# Patient Record
Sex: Female | Born: 1980 | Race: White | Hispanic: No | Marital: Single | Smoking: Current every day smoker
Health system: Southern US, Community
[De-identification: ages and names within clinical notes are randomized; demographics above are authoritative.]

## PROBLEM LIST (undated history)

## (undated) DIAGNOSIS — D649 Anemia, unspecified: Secondary | ICD-10-CM

## (undated) DIAGNOSIS — F410 Panic disorder [episodic paroxysmal anxiety] without agoraphobia: Secondary | ICD-10-CM

## (undated) DIAGNOSIS — M549 Dorsalgia, unspecified: Secondary | ICD-10-CM

## (undated) DIAGNOSIS — G4700 Insomnia, unspecified: Secondary | ICD-10-CM

## (undated) DIAGNOSIS — U071 COVID-19: Secondary | ICD-10-CM

## (undated) DIAGNOSIS — R002 Palpitations: Secondary | ICD-10-CM

## (undated) DIAGNOSIS — Z87442 Personal history of urinary calculi: Secondary | ICD-10-CM

## (undated) DIAGNOSIS — N289 Disorder of kidney and ureter, unspecified: Secondary | ICD-10-CM

## (undated) DIAGNOSIS — F4310 Post-traumatic stress disorder, unspecified: Secondary | ICD-10-CM

## (undated) DIAGNOSIS — R569 Unspecified convulsions: Secondary | ICD-10-CM

## (undated) DIAGNOSIS — G629 Polyneuropathy, unspecified: Secondary | ICD-10-CM

## (undated) DIAGNOSIS — J189 Pneumonia, unspecified organism: Secondary | ICD-10-CM

## (undated) HISTORY — PX: TUBAL LIGATION: SHX77

## (undated) HISTORY — PX: OTHER SURGICAL HISTORY: SHX169

## (undated) HISTORY — PX: TONSILLECTOMY: SUR1361

---

## 2019-09-24 ENCOUNTER — Other Ambulatory Visit: Payer: Self-pay

## 2019-09-24 ENCOUNTER — Emergency Department (HOSPITAL_COMMUNITY)
Admission: EM | Admit: 2019-09-24 | Discharge: 2019-09-24 | Disposition: A | Discharge status: Home / Self Care | Payer: Medicaid - Out of State | Attending: Emergency Medicine | Admitting: Emergency Medicine

## 2019-09-24 ENCOUNTER — Encounter (HOSPITAL_COMMUNITY): Payer: Self-pay | Admitting: Emergency Medicine

## 2019-09-24 DIAGNOSIS — M5442 Lumbago with sciatica, left side: Secondary | ICD-10-CM | POA: Diagnosis not present

## 2019-09-24 DIAGNOSIS — M545 Low back pain: Secondary | ICD-10-CM | POA: Diagnosis present

## 2019-09-24 DIAGNOSIS — F17210 Nicotine dependence, cigarettes, uncomplicated: Secondary | ICD-10-CM | POA: Diagnosis not present

## 2019-09-24 DIAGNOSIS — M5441 Lumbago with sciatica, right side: Secondary | ICD-10-CM | POA: Diagnosis not present

## 2019-09-24 HISTORY — DX: Polyneuropathy, unspecified: G62.9

## 2019-09-24 HISTORY — DX: Dorsalgia, unspecified: M54.9

## 2019-09-24 HISTORY — DX: Palpitations: R00.2

## 2019-09-24 MED ORDER — PREDNISONE 10 MG (21) PO TBPK: ORAL_TABLET | ORAL | 0 refills | Status: DC

## 2019-09-24 MED ORDER — LIDOCAINE 4 % EX CREA: 1.0000 "application " | TOPICAL_CREAM | Freq: Three times a day (TID) | CUTANEOUS | 0 refills | Status: DC | PRN

## 2019-09-24 MED ORDER — CYCLOBENZAPRINE HCL 10 MG PO TABS: 10.0000 mg | ORAL_TABLET | Freq: Two times a day (BID) | ORAL | 0 refills | Status: DC | PRN

## 2019-09-24 MED ORDER — CYCLOBENZAPRINE HCL 10 MG PO TABS
10.0000 mg | ORAL_TABLET | Freq: Once | ORAL | Status: AC
  Administered 2019-09-24: 10 mg via ORAL
  Filled 2019-09-24: qty 1

## 2019-09-24 MED ORDER — HYDROMORPHONE HCL 1 MG/ML IJ SOLN
1.0000 mg | Freq: Once | INTRAMUSCULAR | Status: AC
  Administered 2019-09-24: 1 mg via INTRAMUSCULAR
  Filled 2019-09-24: qty 1

## 2019-09-24 NOTE — Discharge Instructions (Addendum)
1. Medications: Start taking steroid taper as prescribed.  Do not take ibuprofen, Advil, Motrin, or Aleve while taking this medication.  You can take 1 to 2 tablets of Tylenol every 6 hours additionally however.  When you are done taking the steroid taper you can alternate 600 mg of ibuprofen and [phone] mg of Tylenol every 3 hours as needed for pain. Do not exceed 4000 mg of Tylenol daily.  Take ibuprofen with food to avoid upset stomach issues.  You can take Flexeril as needed for muscle spasm up to twice daily but do not drive, drink alcohol, or operate heavy machinery while taking this medicine because it may make you drowsy.  I typically recommend taking this medicine only at night when you are going to sleep.  You can also cut these tablets in half if they make you feel very drowsy.  Apply lidocaine cream to areas of pain. 2. Treatment: rest, drink plenty of fluids, gentle stretching as discussed (see attached), alternate ice and heat (or stick with whichever feels best) 20 minutes on 20 minutes off. 3. Follow Up: Please followup with your primary doctor in 3-7 days for discussion of your diagnoses and further evaluation after today's visit; I have also provided information for a neurosurgeon you can follow-up with; return to the ER for worsening back pain, difficulty walking, loss of bowel or bladder control or other concerning symptoms

## 2019-09-24 NOTE — ED Notes (Signed)
Patient verbalizes understanding of discharge instructions. Opportunity for questioning and answers were provided. Armband removed by staff, pt discharged from ED ambulatory w/ son 

## 2019-09-24 NOTE — ED Triage Notes (Signed)
C/o pain to lower back and posterior neck pain x 4 days.  Reports history of chronic back pain that has been flared-up over the last 4 days.

## 2019-09-24 NOTE — ED Provider Notes (Signed)
[facility] EMERGENC[facility] Provider Note   CSN: [id number] Arrival date & time: 09/24/19  1214     History   Chief Complaint Chief Complaint  Patient presents with  . Back Pain    HPI Anne Meyers is a 38 y.o. female with history of chronic back pain, peripheral neuropathy, palpitations presenting for evaluation of acute onset, progressively worsening chronic low back pain for the last 4 days.  She reports that she moved here 5 days ago from [place] and took a bus down here.  While on the bus she attempted to step out of the way of another bus patron and states that she thinks she may have twisted in a way that flared up her usual back pain.  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  Reports that she has seen an orthopedist in [place] previously who recommended lumbar fusion but states that she was not ready to undergo surgery at that time.  She is in the process of transferring her insurance from [place] to [place] and is requesting referral to a [place] surgeon or orthopedist.  She notes constant pain that radiates from the middle of the low back up the lumbar spine.  Also notes pain radiating down the posterior aspect of the bilateral lower extremities.  Denies bowel or bladder incontinence, saddle anesthesia, fevers, or history of IV drug use.  Pain worsens with certain position changes and ambulation.  She has tried over-the-counter Tylenol and ibuprofen/naproxen without relief of symptoms.     The history is provided by the patient.    Past Medical History:  Diagnosis Date  . Back pain   . Neuropathy   . Palpitations     There are no active problems to display for this patient.   History reviewed. No pertinent surgical history.   OB History   No obstetric history on file.      Home Medications    Prior to  Admission medications   Medication Sig Start Date End Date Taking? Authorizing Provider  cyclobenzaprine (FLEXERIL) 10 MG tablet Take 1 tablet (10 mg total) by mouth 2 (two) times daily as needed. 09/24/19   Merlin Ege A, PA-C  lidocaine (LMX) 4 % cream Apply 1 application topically 3 (three) times daily as needed. 09/24/19   Calley Drenning A, PA-C  predniSONE (STERAPRED UNI-PAK 21 TAB) 10 MG (21) TBPK tablet Take 6 tabs by mouth daily x2 days, then 5 tabs x2 days, then 4 tabs x2 days, then 3 tabs x2 days, 2 tabs x2 days, then 1 tab daily x2 days 09/24/19   Jeanie SewerFawze, Raelle Chambers A, PA-C    Family History No family history on file.  Social History Social History   Tobacco Use  . Smoking status: Current Every Day Smoker  . Smokeless tobacco: Never Used  Substance Use Topics  . Alcohol use: Never    Frequency: Never  . Drug use: Never     Allergies   Patient has no allergy information on record.   Review of Systems Review of Systems  Constitutional: Negative for chills and fever.  Genitourinary: Negative for difficulty urinating.  Musculoskeletal: Positive for back pain.     Physical Exam Updated Vital Signs BP 126/85 (BP Location: Left Arm)   Pulse 92   Temp 98.2 F (36.8 C) (Oral)   Resp 17   Ht 4'  5" (1.346 m)   Wt 90.7 kg   LMP 09/18/2019   SpO2 97%   BMI 50.06 kg/m   Physical Exam Vitals signs and nursing note reviewed.  Constitutional:      General: She is not in acute distress.    Appearance: She is well-developed.  HENT:     Head: Normocephalic and atraumatic.  Eyes:     General:        Right eye: No discharge.        Left eye: No discharge.     Conjunctiva/sclera: Conjunctivae normal.  Neck:     Vascular: No JVD.     Trachea: No tracheal deviation.  Cardiovascular:     Rate and Rhythm: Normal rate.     Pulses: Normal pulses.     Comments: 2+ DP/PT pulses bilaterally, Homans sign absent bilaterally, no lower extremity edema, no palpable cords, compartments are  soft  Pulmonary:     Effort: Pulmonary effort is normal.  Abdominal:     General: There is no distension.  Musculoskeletal:        General: Tenderness present.     Comments: Diffuse midline lumbar spine tenderness maximally tender around the levels of L4-S1 with bilateral paralumbar muscle tenderness.  Bilateral SI joint tenderness noted.  No deformity, crepitus, or step-off.  5/5 strength of BLE major muscle groups.  Decreased range of motion of the lumbar spine with pain elicited with both flexion and extension, worse with flexion  Skin:    General: Skin is warm and dry.     Findings: No erythema.  Neurological:     Mental Status: She is alert.     Comments: Sensation intact to light touch of bilateral lower extremities.  Ambulatory with mildly antalgic gait but is able to heel walk and toe walk without difficulty.  Exhibits good balance.  Psychiatric:        Behavior: Behavior normal.      ED Treatments / Results  Labs (all labs ordered are listed, but only abnormal results are displayed) Labs Reviewed - No data to display  EKG None  Radiology No results found.  Procedures Procedures (including critical care time)  Medications Ordered in ED Medications  HYDROmorphone (DILAUDID) injection 1 mg (1 mg Intramuscular Given 09/24/19 1419)  cyclobenzaprine (FLEXERIL) tablet 10 mg (10 mg Oral Given 09/24/19 1419)     Initial Impression / Assessment and Plan / ED Course  I have reviewed the triage vital signs and the nursing notes.  Pertinent labs & imaging results that were available during my care of the patient were reviewed by me and considered in my medical decision making (see chart for details).        Patient presenting for evaluation of acute on chronic low back pain.  Likely precipitated by a long bus ride and twisting injury.  She is afebrile, vital signs are stable.  She is nontoxic in appearance.  She is neurovascularly intact.  She reports that this feels like  previous flareups of chronic back pain.  She recently relocated from [place] to [facility] and is in the process of switching over her insurance.  She is requesting referral to a neurosurgeon or orthopedist which I think is reasonable.  She is ambulatory in the ED without difficulty despite pain.  No red flag signs concerning for cauda equina or spinal abscess.  Doubt dissection, no abdominal pain.  No urinary symptoms.  Conservative therapy indicated and discussed with patient.  Pain managed in  the ED.  Will give referral to orthopedist and neurosurgery for outpatient follow-up.  Discussed strict ED return precautions. Patient verbalized understanding of and agreement with plan and is safe for discharge home at this time.   Final Clinical Impressions(s) / ED Diagnoses   Final diagnoses:  Acute bilateral low back pain with bilateral sciatica    ED Discharge Orders         Ordered    predniSONE (STERAPRED UNI-PAK 21 TAB) 10 MG (21) TBPK tablet     09/24/19 1420    cyclobenzaprine (FLEXERIL) 10 MG tablet  2 times daily PRN     09/24/19 1420    lidocaine (LMX) 4 % cream  3 times daily PRN     09/24/19 1420           Mekhia Brogan, Pearl A, PA-C 09/24/19 1423    Hayden Rasmussen, MD 09/24/19 1805

## 2019-09-25 ENCOUNTER — Emergency Department (HOSPITAL_COMMUNITY)
Admission: EM | Admit: 2019-09-25 | Discharge: 2019-09-25 | Disposition: A | Discharge status: Home / Self Care | Payer: Medicaid - Out of State | Attending: Emergency Medicine | Admitting: Emergency Medicine

## 2019-09-25 ENCOUNTER — Encounter (HOSPITAL_COMMUNITY): Payer: Self-pay | Admitting: *Deleted

## 2019-09-25 ENCOUNTER — Other Ambulatory Visit: Payer: Self-pay

## 2019-09-25 DIAGNOSIS — G8929 Other chronic pain: Secondary | ICD-10-CM | POA: Diagnosis not present

## 2019-09-25 DIAGNOSIS — Z79899 Other long term (current) drug therapy: Secondary | ICD-10-CM | POA: Diagnosis not present

## 2019-09-25 DIAGNOSIS — M549 Dorsalgia, unspecified: Secondary | ICD-10-CM | POA: Diagnosis present

## 2019-09-25 DIAGNOSIS — Z5900 Homelessness unspecified: Secondary | ICD-10-CM

## 2019-09-25 DIAGNOSIS — F17200 Nicotine dependence, unspecified, uncomplicated: Secondary | ICD-10-CM | POA: Insufficient documentation

## 2019-09-25 MED ORDER — CYCLOBENZAPRINE HCL 10 MG PO TABS
10.0000 mg | ORAL_TABLET | Freq: Once | ORAL | Status: AC
  Administered 2019-09-25: 10 mg via ORAL
  Filled 2019-09-25: qty 1

## 2019-09-25 MED ORDER — LIDOCAINE 4 % EX CREA: 1.0000 "application " | TOPICAL_CREAM | Freq: Three times a day (TID) | CUTANEOUS | 0 refills | Status: DC | PRN

## 2019-09-25 MED ORDER — HYDROMORPHONE HCL 1 MG/ML IJ SOLN
1.0000 mg | Freq: Once | INTRAMUSCULAR | Status: AC
  Administered 2019-09-25: 1 mg via INTRAMUSCULAR
  Filled 2019-09-25: qty 1

## 2019-09-25 MED ORDER — CYCLOBENZAPRINE HCL 10 MG PO TABS: 10.0000 mg | ORAL_TABLET | Freq: Two times a day (BID) | ORAL | 0 refills | Status: DC | PRN

## 2019-09-25 MED ORDER — PREDNISONE 10 MG (21) PO TBPK: ORAL_TABLET | ORAL | 0 refills | Status: DC

## 2019-09-25 NOTE — Care Management (Signed)
  [facility] Medication Assistance Card Name: [name] ID (MRN): [id number] [place]: [id number] RX Group: [id number] Discharge Date: 09/25/19 Expiration Date: 10/05/19                                           (must be filled within 7 days of discharge)      Dear   :  Anne Meyers  You have been approved to have the prescriptions written by your discharging physician filled through our [facility] (Medication Assistance Through [facility]) program. This program allows for a one-time (no refills) 34-day supply of selected medications for a low copay amount.  The copay is $3.00 per prescription. For instance, if you have one prescription, you will pay $3.00; for two prescriptions, you pay $6.00; for three prescriptions, you pay $9.00; and so on.  Only certain pharmacies are participating in this program with [facility]. You will need to select one of the pharmacies from the attached list and take your prescriptions, this letter, and your photo ID to one of the participating pharmacies.   We are excited that you are able to use the [facility] program to get your medications. These prescriptions must be filled within 7 days of hospital discharge or they will no longer be valid for the [facility] program. Should you have any problems with your prescriptions please contact your case management team member at [phone] for [facility] you, [place] [facility]

## 2019-09-25 NOTE — ED Provider Notes (Signed)
[facility] EMERGENCY DEPARTMENT Provider Note   CSN: [id number] Arrival date & time: 09/25/19  1454     History   Chief Complaint Chief Complaint  Patient presents with  . Back Pain    HPI Anne Meyers is a 38 y.o. female with history of chronic back pain, peripheral neuropathy, palpitations returns today requesting medication assistance.  I saw her in the ED yesterday for flareup of her chronic back pain.  She recently moved here from [place] and is in the process of switching over her insurance.  Yesterday she also requested referral to neurosurgery and orthopedic surgeon for reevaluation of her chronic back pain and candidacy for surgery.  She reports that she had complete pain relief with the Dilaudid and Flexeril she was given in the ED yesterday, awoke this morning with recurrence of her pain.  No bowel or bladder incontinence, saddle anesthesia, fevers or history of IV drug use.  She attempted to fill prescriptions for prednisone taper, Flexeril, lidocaine cream at multiple pharmacies in the area but reports the total was over $100 and she could not afford this.  She is requesting assistance from case management/social work.  She tells me that she is in fact homeless at this time after spending all of her money to move from [place] to [facility] and has been sleeping on the cold floor.  She would like for social worker case management to assist in placement at a shelter or temporary housing of some sort.    The history is provided by the patient.    Past Medical History:  Diagnosis Date  . Back pain   . Neuropathy   . Palpitations     There are no active problems to display for this patient.   History reviewed. No pertinent surgical history.   OB History   No obstetric history on file.      Home Medications    Prior to Admission medications   Medication Sig Start Date End Date Taking? Authorizing Provider   cyclobenzaprine (FLEXERIL) 10 MG tablet Take 1 tablet (10 mg total) by mouth 2 (two) times daily as needed. 09/25/19   Sahej Hauswirth A, PA-C  lidocaine (LMX) 4 % cream Apply 1 application topically 3 (three) times daily as needed. 09/25/19   Dvid Pendry A, PA-C  predniSONE (STERAPRED UNI-PAK 21 TAB) 10 MG (21) TBPK tablet Take 6 tabs by mouth daily x2 days, then 5 tabs x2 days, then 4 tabs x2 days, then 3 tabs x2 days, 2 tabs x2 days, then 1 tab daily x2 days 09/25/19   Jeanie Sewer, PA-C    Family History No family history on file.  Social History Social History   Tobacco Use  . Smoking status: Current Every Day Smoker  . Smokeless tobacco: Never Used  Substance Use Topics  . Alcohol use: Never    Frequency: Never  . Drug use: Never     Allergies   Patient has no known allergies.   Review of Systems Review of Systems  Constitutional: Negative for fever.  Musculoskeletal: Positive for back pain.  Neurological: Negative for weakness and numbness.     Physical Exam Updated Vital Signs BP 132/83 (BP Location: Left Arm)   Pulse (!) 101   Temp 98.1 F (36.7 C) (Oral)   Resp 18   Ht 5\' 4"  (1.626 m)   Wt 90.7 kg   LMP 09/18/2019   SpO2 96%   BMI 34.33 kg/m   Physical Exam  Vitals signs and nursing note reviewed.  Constitutional:      General: She is not in acute distress.    Appearance: She is well-developed.     Comments: Appears somewhat anxious, tearful.  Resting in bed.  HENT:     Head: Normocephalic and atraumatic.  Eyes:     General:        Right eye: No discharge.        Left eye: No discharge.     Conjunctiva/sclera: Conjunctivae normal.  Neck:     Vascular: No JVD.     Trachea: No tracheal deviation.  Cardiovascular:     Rate and Rhythm: Normal rate.  Pulmonary:     Effort: Pulmonary effort is normal.  Abdominal:     General: There is no distension.  Musculoskeletal:     Comments: Ambulatory with antalgic gait but exhibits steady balance.  Skin:     General: Skin is warm and dry.     Findings: No erythema.  Neurological:     Mental Status: She is alert.     Comments: Moves all extremities spontaneously without difficulty.  Psychiatric:        Behavior: Behavior normal.      ED Treatments / Results  Labs (all labs ordered are listed, but only abnormal results are displayed) Labs Reviewed - No data to display  EKG None  Radiology No results found.  Procedures Procedures (including critical care time)  Medications Ordered in ED Medications  HYDROmorphone (DILAUDID) injection 1 mg (1 mg Intramuscular Given 09/25/19 1638)  cyclobenzaprine (FLEXERIL) tablet 10 mg (10 mg Oral Given 09/25/19 1638)     Initial Impression / Assessment and Plan / ED Course  I have reviewed the triage vital signs and the nursing notes.  Pertinent labs & imaging results that were available during my care of the patient were reviewed by me and considered in my medical decision making (see chart for details).        Patient returns to the ED today with ongoing low back pain as well as requesting assistance with medications and housing.  She is afebrile, initially mildly tachycardic but this is likely secondary to pain.  She is neurovascularly intact, ambulatory despite pain.  No red flag signs concerning for cauda equina or spinal abscess.  Her pain has been managed in the ED.  I spoke with Anola GurneyWanda Rogers with case management.  She was able to use the [facility] program and waive the patient's co-pay for her medications.  She also gave information for the crisis hotline through [address] but patient stated this number was not working due to it being the weekend. I called the number and was able to speak with a staff member, then transferred to the patient so that she may arrange for emergency housing. Will also give her resources for local shelters in the area, as well as [place] [facility] for primary care services and financial assistance related to  her medical care.   [employer] Crisis Line spoke with patient, taxi voucher given.  Discussed strict ED return precautions.  Patient hemodynamically stable for discharge at this time.  Final Clinical Impressions(s) / ED Diagnoses   Final diagnoses:  Medication management  Homelessness    ED Discharge Orders         Ordered    cyclobenzaprine (FLEXERIL) 10 MG tablet  2 times daily PRN     09/25/19 1629    lidocaine (LMX) 4 % cream  3 times  daily PRN     09/25/19 1629    predniSONE (STERAPRED UNI-PAK 21 TAB) 10 MG (21) TBPK tablet     09/25/19 1629           Renita Papa, PA-C 09/25/19 1909    Drenda Freeze, MD 09/25/19 [phone]

## 2019-09-25 NOTE — Discharge Instructions (Addendum)
Go to [facility] on [address] for your medications.  Our case manager has made it so that the co-pay is waived.  The medications will not cost you any money.   I have attached resources for local shelters as well.  Call [facility] [place] [facility] tomorrow after 9 AM and tell them you were referred from the emergency department.  They will get you set up for primary care services and they also have financial advisors that can help with your medications and follow-up with specialist in the future.  Return to the emergency department if any concerning signs or symptoms develop.

## 2019-09-25 NOTE — ED Triage Notes (Signed)
She has chronic back pain and [place] .  Her insurance is not working here she was seen here yesterday and given rxs but her insuraNCE IS NOT WORKING an d she has no money  shes herfe for case management

## 2019-09-25 NOTE — ED Notes (Signed)
Cab voucher filled out and given to registration in front lobby.

## 2019-09-25 NOTE — Care Management (Signed)
ED CM received another from EDP concerning patient now stating that she is homeless.  CM attempted to speak with patient via telephone no answer on room phone and  personal line not  Accepting any phone calls. CM provided [place] for assistance.patient can call from ED.

## 2019-09-25 NOTE — Care Management (Signed)
ED CM received consult concerning medication assistance need,patient would be eligible for [facility] Match medication assistance program, CM able to enroll patient and send [place] letter to 24 hour [facility] on [address], Patient can f/u at the [facility].

## 2019-09-28 ENCOUNTER — Other Ambulatory Visit: Payer: Self-pay

## 2019-09-28 ENCOUNTER — Emergency Department (HOSPITAL_COMMUNITY)
Admission: EM | Admit: 2019-09-28 | Discharge: 2019-09-28 | Disposition: A | Discharge status: Home / Self Care | Payer: Medicaid - Out of State | Attending: Emergency Medicine | Admitting: Emergency Medicine

## 2019-09-28 ENCOUNTER — Encounter (HOSPITAL_COMMUNITY): Payer: Self-pay | Admitting: Emergency Medicine

## 2019-09-28 DIAGNOSIS — F17210 Nicotine dependence, cigarettes, uncomplicated: Secondary | ICD-10-CM | POA: Insufficient documentation

## 2019-09-28 DIAGNOSIS — M545 Low back pain, unspecified: Secondary | ICD-10-CM

## 2019-09-28 DIAGNOSIS — Z79899 Other long term (current) drug therapy: Secondary | ICD-10-CM | POA: Insufficient documentation

## 2019-09-28 DIAGNOSIS — G8929 Other chronic pain: Secondary | ICD-10-CM | POA: Insufficient documentation

## 2019-09-28 MED ORDER — OXYCODONE-ACETAMINOPHEN 5-325 MG PO TABS
1.0000 | ORAL_TABLET | Freq: Once | ORAL | Status: AC
  Administered 2019-09-28: 10:00:00 1 via ORAL
  Filled 2019-09-28: qty 1

## 2019-09-28 MED ORDER — CYCLOBENZAPRINE HCL 10 MG PO TABS
10.0000 mg | ORAL_TABLET | Freq: Once | ORAL | Status: AC
  Administered 2019-09-28: 10 mg via ORAL
  Filled 2019-09-28: qty 1

## 2019-09-28 MED ORDER — KETOROLAC TROMETHAMINE 30 MG/ML IJ SOLN
30.0000 mg | Freq: Once | INTRAMUSCULAR | Status: AC
  Administered 2019-09-28: 10:00:00 30 mg via INTRAMUSCULAR
  Filled 2019-09-28: qty 1

## 2019-09-28 NOTE — Discharge Instructions (Addendum)
It is important for you to follow-up with the specialist on Friday. Return to the ED if you start to have worsening symptoms, injuries or falls, numbness in arms or legs, losing control of your bowels or bladder.

## 2019-09-28 NOTE — ED Provider Notes (Signed)
[facility] EMERGENCY DEPARTMENT Provider Note   CSN: [id number] Arrival date & time: 09/28/19  4403     History   Chief Complaint Chief Complaint  Patient presents with  . Back Pain    HPI Anne Meyers is a 38 y.o. female with a past medical history of chronic back pain, neuropathy who presents to ED with a chief complaint of back pain.  This is her third visit in the past week for her back pain.  Initially she was seen and subsequently seen for medication assistance.  States that she was able to get her prednisone and Flexeril filled however is still having pain.  She is scheduled to see a spine specialist in 2 days but was hoping to be given medications for pain control today.  She states that "I do not want anything to go home with, I do not want to become an addict because my mom was an addict."  She denies any injuries or falls, numbness in arms or legs, loss of bowel or bladder function, prior back surgeries, history of cancer, she of IV drug use, fever, urinary symptoms.     HPI  Past Medical History:  Diagnosis Date  . Back pain   . Neuropathy   . Palpitations     There are no active problems to display for this patient.   History reviewed. No pertinent surgical history.   OB History   No obstetric history on file.      Home Medications    Prior to Admission medications   Medication Sig Start Date End Date Taking? Authorizing Provider  cyclobenzaprine (FLEXERIL) 10 MG tablet Take 1 tablet (10 mg total) by mouth 2 (two) times daily as needed. 09/25/19   Fawze, Mina A, PA-C  lidocaine (LMX) 4 % cream Apply 1 application topically 3 (three) times daily as needed. 09/25/19   Fawze, Mina A, PA-C  predniSONE (STERAPRED UNI-PAK 21 TAB) 10 MG (21) TBPK tablet Take 6 tabs by mouth daily x2 days, then 5 tabs x2 days, then 4 tabs x2 days, then 3 tabs x2 days, 2 tabs x2 days, then 1 tab daily x2 days 09/25/19   Jeanie Sewer, PA-C    Family History  History reviewed. No pertinent family history.  Social History Social History   Tobacco Use  . Smoking status: Current Every Day Smoker  . Smokeless tobacco: Never Used  Substance Use Topics  . Alcohol use: Never    Frequency: Never  . Drug use: Never     Allergies   Patient has no known allergies.   Review of Systems Review of Systems  Constitutional: Negative for chills and fever.  Genitourinary: Negative for dysuria.  Musculoskeletal: Positive for back pain and myalgias.  Neurological: Negative for weakness and numbness.     Physical Exam Updated Vital Signs BP (!) 143/90 (BP Location: Right Arm)   Pulse 84   Temp 98.2 F (36.8 C) (Oral)   Resp 16   LMP 09/18/2019   SpO2 100%   Physical Exam Vitals signs and nursing note reviewed.  Constitutional:      General: She is not in acute distress.    Appearance: She is well-developed. She is not diaphoretic.  HENT:     Head: Normocephalic and atraumatic.  Eyes:     General: No scleral icterus.    Conjunctiva/sclera: Conjunctivae normal.  Neck:     Musculoskeletal: Normal range of motion.  Cardiovascular:     Rate and Rhythm:  Normal rate and regular rhythm.     Heart sounds: Normal heart sounds.  Pulmonary:     Effort: Pulmonary effort is normal. No respiratory distress.     Breath sounds: Normal breath sounds.  Musculoskeletal:       Back:     Comments: No midline spinal tenderness present in lumbar, thoracic or cervical spine. No step-off palpated. No visible bruising, edema or temperature change noted. No objective signs of numbness present. No saddle anesthesia. 2+ DP pulses bilaterally. Sensation intact to light touch. Strength 5/5 in bilateral lower extremities.  Skin:    Findings: No rash.  Neurological:     Mental Status: She is alert.      ED Treatments / Results  Labs (all labs ordered are listed, but only abnormal results are displayed) Labs Reviewed - No data to display  EKG None   Radiology No results found.  Procedures Procedures (including critical care time)  Medications Ordered in ED Medications  oxyCODONE-acetaminophen (PERCOCET/ROXICET) 5-325 MG per tablet 1 tablet (1 tablet Oral Given 09/28/19 0947)  cyclobenzaprine (FLEXERIL) tablet 10 mg (10 mg Oral Given 09/28/19 0947)  ketorolac (TORADOL) 30 MG/ML injection 30 mg (30 mg Intramuscular Given 09/28/19 0948)     Initial Impression / Assessment and Plan / ED Course  I have reviewed the triage vital signs and the nursing notes.  Pertinent labs & imaging results that were available during my care of the patient were reviewed by me and considered in my medical decision making (see chart for details).        Patient denies any concerning symptoms suggestive of cauda equina requiring urgent imaging at this time such as loss of sensation in the lower extremities, lower extremity weakness, loss of bowel or bladder control, saddle anesthesia, urinary retention, fever/chills, IVDU. Exam demonstrated no  weakness on exam today. No preceding injury or trauma to suggest acute fracture. Doubt pelvic or urinary pathology for patient's acute back pain, as patient denies urinary symptoms. Doubt AAA as cause of patient's back pain as patient lacks major risk factors, had no abdominal TTP, and has symmetric and intact distal pulses. Patient given strict return precautions for any symptoms indicating worsening neurologic function in the lower extremities.  Suspect that this is a flareup of her chronic pain as patient is also concerned for the same.  Pain treated here and will have her continue her home medications and follow-up with a specialist in 2 days.  Patient is hemodynamically stable, in NAD, and able to ambulate in the ED. Evaluation does not show pathology that would require ongoing emergent intervention or inpatient treatment. I explained the diagnosis to the patient. Pain has been managed and has no complaints prior to  discharge. Patient is comfortable with above plan and is stable for discharge at this time. All questions were answered prior to disposition. Strict return precautions for returning to the ED were discussed. Encouraged follow up with PCP.   An After Visit Summary was printed and given to the patient.   Portions of this note were generated with [employer]. Dictation errors may occur despite best attempts at proofreading.    Final Clinical Impressions(s) / ED Diagnoses   Final diagnoses:  Chronic midline low back pain without sciatica    ED Discharge Orders    None       Delia Heady, PA-C 09/28/19 [place], [place], DO 09/28/19 1207

## 2019-09-28 NOTE — ED Triage Notes (Signed)
Pt arrives to ED with complaints of chronic back pain. Patient is currently homeless and was here for same on 12/6. Patient was able to get her prednisone and flexeril filled but has had no relief.

## 2019-09-28 NOTE — ED Notes (Signed)
Patient verbalizes understanding of discharge instructions. Opportunity for questioning and answers were provided. Armband removed by staff, pt discharged from ED.  

## 2019-11-17 ENCOUNTER — Emergency Department (HOSPITAL_COMMUNITY)
Admission: EM | Admit: 2019-11-17 | Discharge: 2019-11-17 | Disposition: A | Discharge status: Home / Self Care | Payer: Medicaid - Out of State | Attending: Emergency Medicine | Admitting: Emergency Medicine

## 2019-11-17 ENCOUNTER — Other Ambulatory Visit: Payer: Self-pay

## 2019-11-17 ENCOUNTER — Encounter (HOSPITAL_COMMUNITY): Payer: Self-pay | Admitting: Emergency Medicine

## 2019-11-17 DIAGNOSIS — G4700 Insomnia, unspecified: Secondary | ICD-10-CM | POA: Diagnosis present

## 2019-11-17 DIAGNOSIS — R569 Unspecified convulsions: Secondary | ICD-10-CM | POA: Diagnosis not present

## 2019-11-17 DIAGNOSIS — F17210 Nicotine dependence, cigarettes, uncomplicated: Secondary | ICD-10-CM | POA: Insufficient documentation

## 2019-11-17 DIAGNOSIS — Z79899 Other long term (current) drug therapy: Secondary | ICD-10-CM | POA: Diagnosis not present

## 2019-11-17 HISTORY — DX: Unspecified convulsions: R56.9

## 2019-11-17 HISTORY — DX: Panic disorder (episodic paroxysmal anxiety): F41.0

## 2019-11-17 HISTORY — DX: Post-traumatic stress disorder, unspecified: F43.10

## 2019-11-17 HISTORY — DX: Insomnia, unspecified: G47.00

## 2019-11-17 MED ORDER — QUETIAPINE FUMARATE 300 MG PO TABS
400.0000 mg | ORAL_TABLET | Freq: Once | ORAL | Status: AC
  Administered 2019-11-17: 400 mg via ORAL
  Filled 2019-11-17: qty 1

## 2019-11-17 MED ORDER — CHLORDIAZEPOXIDE HCL 25 MG PO CAPS
50.0000 mg | ORAL_CAPSULE | Freq: Once | ORAL | Status: AC
  Administered 2019-11-17: 19:00:00 50 mg via ORAL
  Filled 2019-11-17: qty 2

## 2019-11-17 NOTE — Discharge Instructions (Signed)
Follow up with your doctor tomorrow.

## 2019-11-17 NOTE — ED Notes (Addendum)
Pt states that she has to have a cab voucher to get home. Informed patient that ED doesn't have cab vouchers and offered patient bus ticket.  Charge RN made aware and informed patient that we dont have cab vouchers.  Patient states, "Well,  I am not leaving until I get one".  This RN informed patient again that we dont have them not have access to them and can give you a bus ticket.

## 2019-11-17 NOTE — ED Triage Notes (Signed)
Per [facility] pt from hotel that she is currently staying in since moving from [place]. Reports out of her medications for seizures and had 20-30 seizures over the past 6 days, with last one at 630am today. Reports is in process of trying to get insurance here to help with cost of her medications. Pt also out of her Seroquel which has caused her not to be able to sleep the past 6 days.  Vitals: 140/90, 76HR, 18R, 98% on RA. CBG 118.  20g in left forearm.

## 2019-11-17 NOTE — ED Provider Notes (Signed)
[place] DEPT Provider Note   CSN: [id number] Arrival date & time: 11/17/19  1635     History Chief Complaint  Patient presents with  . Seizures  . can't afford medications  . Insomnia    Anne Meyers is a 39 y.o. female.  39 yo F with a cc of decreased sleeping.  Patient states that she has been on sleep aids since she was 39 years old.  Has run out of her medications over the past week.  States that she has been having episodes where she feels that she has been having seizures.  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  Takes Klonopin for this and has run out of that as well.  [facility].  She would like a dose of her medications here today.  Otherwise she denies any chest pain shortness of breath abdominal pain vomiting or diarrhea.  The history is provided by the patient.  Seizures Insomnia Pertinent negatives include no chest pain, no headaches and no shortness of breath.  Illness Severity:  Moderate Onset quality:  Gradual Duration:  6 days Timing:  Constant Progression:  Worsening Chronicity:  New Associated symptoms: no chest pain, no congestion, no fever, no headaches, no myalgias, no nausea, no rhinorrhea, no shortness of breath, no vomiting and no wheezing        Past Medical History:  Diagnosis Date  . Back pain   . Insomnia   . Neuropathy   . Palpitations   . Panic disorder   . PTSD (post-traumatic stress disorder)   . Seizures ([place])     There are no problems to display for this patient.   History reviewed. No pertinent surgical history.   OB History   No obstetric history on file.     No family history on file.  Social History   Tobacco Use  . Smoking status: Current Every Day Smoker      Types: Cigarettes  . Smokeless tobacco: Never Used  Substance Use Topics  . Alcohol use: Never  . Drug use: Never    Home Medications Prior to Admission medications   Medication Sig Start Date End Date Taking? Authorizing Provider  atorvastatin (LIPITOR) 10 MG tablet Take 10 mg by mouth daily.   Yes [provider]  clonazePAM (KLONOPIN) 1 MG tablet Take 1 mg by mouth 2 (two) times daily.   Yes [provider]  gabapentin (NEURONTIN) 600 MG tablet Take 600 mg by mouth 3 (three) times daily.   Yes [provider]  lidocaine (LMX) 4 % cream Apply 1 application topically 3 (three) times daily as needed. 09/25/19  Yes Fawze, Mina A, PA-C  Melatonin 10 MG TABS Take 10 mg by mouth at bedtime as needed (sleep aid).   Yes [provider]  QUEtiapine (SEROQUEL) 400 MG tablet Take 400 mg by mouth at bedtime.   Yes [provider]  cyclobenzaprine (FLEXERIL) 10 MG tablet Take 1 tablet (10 mg total) by mouth 2 (two) times daily as needed. Patient not taking: Reported on 11/17/2019 09/25/19   Rodell Perna A, PA-C  predniSONE (STERAPRED UNI-PAK 21 TAB) 10 MG (21) TBPK tablet Take 6 tabs by mouth daily x2 days, then 5 tabs x2 days, then 4 tabs x2  days, then 3 tabs x2 days, 2 tabs x2 days, then 1 tab daily x2 days Patient not taking: Reported on 11/17/2019 09/25/19   Michela Pitcher A, PA-C    Allergies    Wellbutrin [bupropion]  Review of Systems   Review of Systems  Constitutional: Negative for chills and fever.  HENT: Negative for congestion and rhinorrhea.   Eyes: Negative for redness and visual disturbance.  Respiratory: Negative for shortness of breath and wheezing.   Cardiovascular: Negative for chest pain and palpitations.  Gastrointestinal: Negative for nausea and vomiting.  Genitourinary: Negative for dysuria and urgency.  Musculoskeletal: Negative for arthralgias and myalgias.  Skin: Negative for pallor and wound.  Neurological: Positive for  seizures. Negative for dizziness and headaches.  Psychiatric/Behavioral: The patient has insomnia.     Physical Exam Updated Vital Signs BP (!) 158/74   Pulse 83   Temp 98 F (36.7 C) (Oral)   Resp 19   LMP 11/14/2019   SpO2 99%   Physical Exam Vitals and nursing note reviewed.  Constitutional:      General: She is not in acute distress.    Appearance: She is well-developed. She is not diaphoretic.  HENT:     Head: Normocephalic and atraumatic.  Eyes:     Pupils: Pupils are equal, round, and reactive to light.  Cardiovascular:     Rate and Rhythm: Normal rate and regular rhythm.     Heart sounds: No murmur. No friction rub. No gallop.   Pulmonary:     Effort: Pulmonary effort is normal.     Breath sounds: No wheezing or rales.  Abdominal:     General: There is no distension.     Palpations: Abdomen is soft.     Tenderness: There is no abdominal tenderness.  Musculoskeletal:        General: No tenderness.     Cervical back: Normal range of motion and neck supple.  Skin:    General: Skin is warm and dry.  Neurological:     Mental Status: She is alert and oriented to person, place, and time.  Psychiatric:        Behavior: Behavior normal.     ED Results / Procedures / Treatments   Labs (all labs ordered are listed, but only abnormal results are displayed) Labs Reviewed - No data to display  EKG None  Radiology No results found.  Procedures Procedures (including critical care time)  Medications Ordered in ED Medications  QUEtiapine (SEROQUEL) tablet 400 mg (400 mg Oral Given 11/17/19 1901)  chlordiazePOXIDE (LIBRIUM) capsule 50 mg (50 mg Oral Given 11/17/19 1901)    ED Course  I have reviewed the triage vital signs and the nursing notes.  Pertinent labs & imaging results that were available during my care of the patient were reviewed by me and considered in my medical decision making (see chart for details).    MDM Rules/Calculators/A&P                       39 yo F with a cc of insomnia.  Going on for the past 6 days.  Ran out of meds.  Has follow up tomorrow. Give one dose here.  D/c home.   9:17 PM:  I have discussed the diagnosis/risks/treatment options with the patient and believe the pt to be eligible for discharge home to follow-up with PCP. We also discussed returning to the ED immediately if new or worsening sx occur. We discussed the  sx which are most concerning (e.g., sudden worsening pain, fever, inability to tolerate by mouth) that necessitate immediate return. Medications administered to the patient during their visit and any new prescriptions provided to the patient are listed below.  Medications given during this visit Medications  QUEtiapine (SEROQUEL) tablet 400 mg (400 mg Oral Given 11/17/19 1901)  chlordiazePOXIDE (LIBRIUM) capsule 50 mg (50 mg Oral Given 11/17/19 1901)     The patient appears reasonably screen and/or stabilized for discharge and I doubt any other medical condition or other [facility] requiring further screening, evaluation, or treatment in the ED at this time prior to discharge.   Final Clinical Impression(s) / ED Diagnoses Final diagnoses:  Insomnia, unspecified type  Seizure-like activity [facility])    Rx / DC Orders ED Discharge Orders    None       Melene Plan, DO 11/17/19 2117

## 2020-03-14 ENCOUNTER — Encounter (HOSPITAL_COMMUNITY): Payer: Self-pay | Admitting: Emergency Medicine

## 2020-03-14 ENCOUNTER — Other Ambulatory Visit: Payer: Self-pay

## 2020-03-14 ENCOUNTER — Emergency Department (HOSPITAL_COMMUNITY)
Admission: EM | Admit: 2020-03-14 | Discharge: 2020-03-14 | Disposition: A | Discharge status: Home / Self Care | Payer: Medicare Other | Attending: Emergency Medicine | Admitting: Emergency Medicine

## 2020-03-14 DIAGNOSIS — R569 Unspecified convulsions: Secondary | ICD-10-CM | POA: Insufficient documentation

## 2020-03-14 DIAGNOSIS — Z5321 Procedure and treatment not carried out due to patient leaving prior to being seen by health care provider: Secondary | ICD-10-CM | POA: Insufficient documentation

## 2020-03-14 NOTE — ED Notes (Addendum)
Pt states she does not want to stay. " I know whats going on with me, I am stressed and it caused me to have a seizure. I am already on seizure medications. Theres no reason for me to stay." this nurse attempted to reason with pt and asked if they would liek to stay  Until seen by the doctor but pt refused and also refused IV for blood draw. Pt states she feels fine and would rather go home. Charge notified. Pt also told she would have to sign out against medical advice and pt agreed.

## 2020-03-14 NOTE — ED Triage Notes (Signed)
Patient was picked up by EMS from a motel due to a seizure. EMS reports her seizure was 7 mins long. This seizure was the 1st in 6 month. Pre-seizure the patient "saw spots". She now complains of a headache.    EMS vitals: 121/75 BP 90 HR 16 RR 97% SPO2 room air 90 CBG

## 2020-05-24 ENCOUNTER — Other Ambulatory Visit: Payer: Self-pay

## 2020-05-24 ENCOUNTER — Encounter (HOSPITAL_COMMUNITY): Payer: Self-pay | Admitting: Emergency Medicine

## 2020-05-24 ENCOUNTER — Emergency Department (HOSPITAL_COMMUNITY)
Admission: EM | Admit: 2020-05-24 | Discharge: 2020-05-24 | Disposition: A | Discharge status: Home / Self Care | Payer: Medicare Other | Attending: Emergency Medicine | Admitting: Emergency Medicine

## 2020-05-24 DIAGNOSIS — F17210 Nicotine dependence, cigarettes, uncomplicated: Secondary | ICD-10-CM | POA: Diagnosis not present

## 2020-05-24 DIAGNOSIS — Z760 Encounter for issue of repeat prescription: Secondary | ICD-10-CM

## 2020-05-24 MED ORDER — QUETIAPINE FUMARATE 400 MG PO TABS: 400.0000 mg | ORAL_TABLET | Freq: Every day | ORAL | 1 refills | Status: DC

## 2020-05-24 NOTE — Discharge Instructions (Signed)
Please establish and follow-up with a primary care provider for any further refills of this medication.

## 2020-05-24 NOTE — ED Provider Notes (Signed)
[facility]-EMERGENCY DEPT Provider Note   CSN: [id number] Arrival date & time: 05/24/20  2536     History Chief Complaint  Patient presents with  . Medication Refill    Anne Meyers is a 39 y.o. female.  HPI      Anne Meyers is a 39 y.o. female, with a history of insomnia, panic disorder, presenting to the ED requesting medication refill. Patient states she takes Seroquel to assist with sleep.  She has moved from [place].  She had refills on this medication until recently.  She has not yet established herself with a local PCP.  She ran out of this medication about 4 days ago. She endorses difficulty sleeping, but denies hallucinations, tremors, confusion, dizziness, SI, or any other complaints.     Past Medical History:  Diagnosis Date  . Back pain   . Insomnia   . Neuropathy   . Palpitations   . Panic disorder   . PTSD (post-traumatic stress disorder)   . Seizures ([facility])     There are no problems to display for this patient.   History reviewed. No pertinent surgical history.   OB History   No obstetric history on file.     No family history on file.  Social History   Tobacco Use  . Smoking status: Current Every Day Smoker    Types: Cigarettes  . Smokeless tobacco: Never Used  Substance Use Topics  . Alcohol use: Never  . Drug use: Never    Home Medications Prior to Admission medications   Medication Sig Start Date End Date Taking? Authorizing Provider  atorvastatin (LIPITOR) 10 MG tablet Take 10 mg by mouth daily.    [provider]  clonazePAM (KLONOPIN) 1 MG tablet Take 1 mg by mouth 2 (two) times daily.    [provider]  cyclobenzaprine (FLEXERIL) 10 MG tablet Take 1 tablet (10 mg total) by mouth 2 (two) times daily as needed. Patient not taking: Reported on 11/17/2019 09/25/19   Michela Pitcher A, PA-C  gabapentin (NEURONTIN) 600 MG tablet Take 600 mg by mouth 3 (three) times daily.    [provider]  lidocaine (LMX) 4 % cream Apply 1 application topically 3 (three) times daily as needed. 09/25/19   Michela Pitcher A, PA-C  Melatonin 10 MG TABS Take 10 mg by mouth at bedtime as needed (sleep aid).    [provider]  predniSONE (STERAPRED UNI-PAK 21 TAB) 10 MG (21) TBPK tablet Take 6 tabs by mouth daily x2 days, then 5 tabs x2 days, then 4 tabs x2 days, then 3 tabs x2 days, 2 tabs x2 days, then 1 tab daily x2 days Patient not taking: Reported on 11/17/2019 09/25/19   Michela Pitcher A, PA-C  QUEtiapine (SEROQUEL) 400 MG tablet Take 1 tablet (400 mg total) by mouth at bedtime. 05/24/20 07/23/20  Docia Klar, Ines Bloomer C, PA-C    Allergies    Wellbutrin [bupropion]  Review of Systems   Review of Systems  Constitutional:       Requesting medication refill  Respiratory: Negative for shortness of breath.   Cardiovascular: Negative for chest pain.  Gastrointestinal: Negative for abdominal pain, diarrhea, nausea and vomiting.  Psychiatric/Behavioral: Positive for sleep disturbance. Negative for confusion, dysphoric mood, hallucinations and suicidal ideas.    Physical Exam Updated Vital Signs BP (!) 142/81   Pulse 72   Temp 98.2 F (36.8 C) (Oral)   Resp 16   SpO2 100%   Physical Exam Vitals and  nursing note reviewed.  Constitutional:      General: She is not in acute distress.    Appearance: She is well-developed. She is not diaphoretic.  HENT:     Head: Normocephalic and atraumatic.  Eyes:     Conjunctiva/sclera: Conjunctivae normal.  Cardiovascular:     Rate and Rhythm: Normal rate and regular rhythm.  Pulmonary:     Effort: Pulmonary effort is normal.  Musculoskeletal:     Cervical back: Neck supple.  Skin:    General: Skin is warm and dry.     Coloration: Skin is not pale.  Neurological:     Mental Status: She is alert and oriented to person, place, and time.  Psychiatric:        Mood and Affect: Mood and affect normal.        Speech: Speech normal.         Behavior: Behavior normal.     ED Results / Procedures / Treatments   Labs (all labs ordered are listed, but only abnormal results are displayed) Labs Reviewed - No data to display  EKG None  Radiology No results found.  Procedures Procedures (including critical care time)  Medications Ordered in ED Medications - No data to display  ED Course  I have reviewed the triage vital signs and the nursing notes.  Pertinent labs & imaging results that were available during my care of the patient were reviewed by me and considered in my medical decision making (see chart for details).    MDM Rules/Calculators/A&P                          Patient presents for a medication refill of her Seroquel.  This was previously prescribed by a PCP in [place].  She has not yet established herself with a PCP locally. A bridge prescription was prescribed for this medication.  Resources given for establishing care with a PCP.    Final Clinical Impression(s) / ED Diagnoses Final diagnoses:  Medication refill    Rx / DC Orders ED Discharge Orders         Ordered    QUEtiapine (SEROQUEL) 400 MG tablet  Daily at bedtime     Discontinue  Reprint     05/24/20 0926           Anselm Pancoast, PA-C 05/24/20 1008    Cathren Laine, MD 05/24/20 1249

## 2020-05-24 NOTE — ED Triage Notes (Signed)
Out of her quetiapine for 4-5 days; insomnia.

## 2020-09-10 ENCOUNTER — Encounter (HOSPITAL_COMMUNITY): Payer: Self-pay

## 2020-09-10 ENCOUNTER — Emergency Department (HOSPITAL_COMMUNITY)
Admission: EM | Admit: 2020-09-10 | Discharge: 2020-09-10 | Disposition: A | Discharge status: Home / Self Care | Payer: Medicare Other | Attending: Emergency Medicine | Admitting: Emergency Medicine

## 2020-09-10 ENCOUNTER — Other Ambulatory Visit: Payer: Self-pay

## 2020-09-10 DIAGNOSIS — F17210 Nicotine dependence, cigarettes, uncomplicated: Secondary | ICD-10-CM | POA: Insufficient documentation

## 2020-09-10 DIAGNOSIS — G4700 Insomnia, unspecified: Secondary | ICD-10-CM | POA: Diagnosis not present

## 2020-09-10 DIAGNOSIS — Z760 Encounter for issue of repeat prescription: Secondary | ICD-10-CM

## 2020-09-10 HISTORY — DX: Disorder of kidney and ureter, unspecified: N28.9

## 2020-09-10 MED ORDER — QUETIAPINE FUMARATE 400 MG PO TABS: 400.0000 mg | ORAL_TABLET | Freq: Every day | ORAL | 0 refills | Status: DC

## 2020-09-10 NOTE — ED Provider Notes (Signed)
[facility]-EMERGENCY DEPT Provider Note   CSN: [id number] Arrival date & time: 09/10/20  1033     History Chief Complaint  Patient presents with  . Medication Refill    Anne Meyers is a 39 y.o. female with a past medical history of insomnia, PTSD, seizures presenting to the ED requesting medication refill.  About 2-1/2 weeks ago ran out of her home Seroquel.  She has been on this for several years.  She is having some trouble establishing care with a PCP here since moving to [place] last year.  States that she is in touch with social work and care management regarding establishing care here in hopes that it will happen soon.  She denies any complaints, no chest pain, headache.  HPI     Past Medical History:  Diagnosis Date  . Back pain   . Insomnia   . Neuropathy   . Palpitations   . Panic disorder   . PTSD (post-traumatic stress disorder)   . Renal disorder    kidney stones  . Seizures (HCC)    'pseudo seizures"    There are no problems to display for this patient.   Past Surgical History:  Procedure Laterality Date  . lithrotripsy    . TUBAL LIGATION       OB History   No obstetric history on file.     History reviewed. No pertinent family history.  Social History   Tobacco Use  . Smoking status: Current Every Day Smoker    Packs/day: 0.50    Types: Cigarettes  . Smokeless tobacco: Never Used  Vaping Use  . Vaping Use: Never used  Substance Use Topics  . Alcohol use: Never  . Drug use: Never    Home Medications Prior to Admission medications   Medication Sig Start Date End Date Taking? Authorizing Provider  atorvastatin (LIPITOR) 10 MG tablet Take 10 mg by mouth daily.    [provider]  clonazePAM (KLONOPIN) 1 MG tablet Take 1 mg by mouth 2 (two) times daily.    [provider]  cyclobenzaprine (FLEXERIL) 10 MG tablet Take 1 tablet (10 mg total) by mouth 2 (two) times daily as needed. Patient  not taking: Reported on 11/17/2019 09/25/19   Michela Pitcher A, PA-C  gabapentin (NEURONTIN) 600 MG tablet Take 600 mg by mouth 3 (three) times daily.    [provider]  lidocaine (LMX) 4 % cream Apply 1 application topically 3 (three) times daily as needed. 09/25/19   Michela Pitcher A, PA-C  Melatonin 10 MG TABS Take 10 mg by mouth at bedtime as needed (sleep aid).    [provider]  predniSONE (STERAPRED UNI-PAK 21 TAB) 10 MG (21) TBPK tablet Take 6 tabs by mouth daily x2 days, then 5 tabs x2 days, then 4 tabs x2 days, then 3 tabs x2 days, 2 tabs x2 days, then 1 tab daily x2 days Patient not taking: Reported on 11/17/2019 09/25/19   Michela Pitcher A, PA-C  QUEtiapine (SEROQUEL) 400 MG tablet Take 1 tablet (400 mg total) by mouth at bedtime. 09/10/20   Jaleisa Brose, PA-C    Allergies    Wellbutrin [bupropion]  Review of Systems   Review of Systems  Constitutional: Negative for chills and fever.  Cardiovascular: Negative for chest pain.  Gastrointestinal: Negative for vomiting.  Neurological: Negative for headaches.    Physical Exam Updated Vital Signs BP 133/80 (BP Location: Left Arm)   Pulse 89   Temp 98  F (36.7 C) (Oral)   Resp 16   Ht 5\' 4"  (1.626 m)   Wt 104.3 kg   LMP 09/03/2020   SpO2 100%   BMI 39.48 kg/m   Physical Exam Vitals and nursing note reviewed.  Constitutional:      General: She is not in acute distress.    Appearance: She is well-developed. She is not diaphoretic.  HENT:     Head: Normocephalic and atraumatic.  Eyes:     General: No scleral icterus.    Conjunctiva/sclera: Conjunctivae normal.  Pulmonary:     Effort: Pulmonary effort is normal. No respiratory distress.  Musculoskeletal:     Cervical back: Normal range of motion.  Skin:    Findings: No rash.  Neurological:     Mental Status: She is alert.     ED Results / Procedures / Treatments   Labs (all labs ordered are listed, but only abnormal results are displayed) Labs Reviewed  - No data to display  EKG None  Radiology No results found.  Procedures Procedures (including critical care time)  Medications Ordered in ED Medications - No data to display  ED Course  I have reviewed the triage vital signs and the nursing notes.  Pertinent labs & imaging results that were available during my care of the patient were reviewed by me and considered in my medical decision making (see chart for details).    MDM Rules/Calculators/A&P                          39 year old female requesting refill of her Seroquel.  She ran this medication 2 weeks ago.  Unfortunately she has had trouble establishing care with a primary care provider here since moving to [place] almost 1 year ago.  She is now in touch with case management and social worker in hopes that this will happen soon.  She denies any other complaints.  Will give 2 weeks worth of medication encourage her to continue attempting to establish care with a primary care provider for further medication refills. Return precautions given.  Patient is hemodynamically stable, in NAD, and able to ambulate in the ED. Evaluation does not show pathology that would require ongoing emergent intervention or inpatient treatment. I explained the diagnosis to the patient. Pain has been managed and has no complaints prior to discharge. Patient is comfortable with above plan and is stable for discharge at this time. All questions were answered prior to disposition. Strict return precautions for returning to the ED were discussed. Encouraged follow up with PCP.   An After Visit Summary was printed and given to the patient.   Portions of this note were generated with [place]. Dictation errors may occur despite best attempts at proofreading.  Final Clinical Impression(s) / ED Diagnoses Final diagnoses:  Encounter for medication refill    Rx / DC Orders ED Discharge Orders         Ordered    QUEtiapine (SEROQUEL)  400 MG tablet  Daily at bedtime        09/10/20 1112           09/12/20, PA-C 09/10/20 1112    09/12/20, DO 09/10/20 1124

## 2020-09-10 NOTE — Discharge Instructions (Signed)
Take your medications as prescribed. It is important for you to establish care with a primary care provider for ongoing management and refills of your chronic medication. Return to the ER if you start to experience chest pain, shortness of breath, vision changes, numbness in arms or legs, injuries or falls.

## 2020-09-10 NOTE — ED Triage Notes (Signed)
Patient reports that she has been in the state of [place] since December. Patient states she has tried to get her medication refilled via [facility], but will not refill because she does not have ID. Patient states her hometown city hall is closed due to [employer].  Patient states she needs a refill on Seroquel 400 mg at bedtime

## 2020-10-08 ENCOUNTER — Emergency Department (HOSPITAL_COMMUNITY)
Admission: EM | Admit: 2020-10-08 | Discharge: 2020-10-08 | Disposition: A | Discharge status: Home / Self Care | Payer: Medicare Other | Attending: Emergency Medicine | Admitting: Emergency Medicine

## 2020-10-08 ENCOUNTER — Encounter (HOSPITAL_COMMUNITY): Payer: Self-pay

## 2020-10-08 ENCOUNTER — Other Ambulatory Visit: Payer: Self-pay

## 2020-10-08 DIAGNOSIS — F17210 Nicotine dependence, cigarettes, uncomplicated: Secondary | ICD-10-CM | POA: Insufficient documentation

## 2020-10-08 DIAGNOSIS — Z760 Encounter for issue of repeat prescription: Secondary | ICD-10-CM | POA: Diagnosis not present

## 2020-10-08 MED ORDER — QUETIAPINE FUMARATE 400 MG PO TABS: 400.0000 mg | ORAL_TABLET | Freq: Every day | ORAL | 0 refills | Status: DC

## 2020-10-08 NOTE — ED Provider Notes (Signed)
[facility]-EMERGENCY DEPT Provider Note   CSN: [id number] Arrival date & time: 10/08/20  0744     History Chief Complaint  Patient presents with  . Medication Refill    Anne Meyers is a 39 y.o. female past medical history significant for insomnia, panic disorder, PTSD, pseudoseizures.  HPI Patient presents to emergency room today with chief complaint of medication refill. She states she is out of her Seroquel. She has been out of it for x2 weeks. She moved here from [place] earlier in the pandemic and lost her identification. She states she has the pandemic the city all in her home count is closed and she was unable to get any copies of her for significant or Social Security card. She is actively working on this. She states she has able to stop taking all of her other psych medications however feels like the Seroquel really does help her sleep. She denies being in any physical pain. Denies any suicidal or homicidal ideations. No hallucinations. Denies any drug or alcohol use.    Past Medical History:  Diagnosis Date  . Back pain   . Insomnia   . Neuropathy   . Palpitations   . Panic disorder   . PTSD (post-traumatic stress disorder)   . Renal disorder    kidney stones  . Seizures (HCC)    'pseudo seizures"    There are no problems to display for this patient.   Past Surgical History:  Procedure Laterality Date  . lithrotripsy    . TUBAL LIGATION       OB History   No obstetric history on file.     History reviewed. No pertinent family history.  Social History   Tobacco Use  . Smoking status: Current Every Day Smoker    Packs/day: 0.50    Types: Cigarettes  . Smokeless tobacco: Never Used  Vaping Use  . Vaping Use: Never used  Substance Use Topics  . Alcohol use: Never  . Drug use: Never    Home Medications Prior to Admission medications   Medication Sig Start Date End Date Taking? Authorizing Provider  atorvastatin  (LIPITOR) 10 MG tablet Take 10 mg by mouth daily.    [provider]  clonazePAM (KLONOPIN) 1 MG tablet Take 1 mg by mouth 2 (two) times daily.    [provider]  cyclobenzaprine (FLEXERIL) 10 MG tablet Take 1 tablet (10 mg total) by mouth 2 (two) times daily as needed. Patient not taking: Reported on 11/17/2019 09/25/19   Michela Pitcher A, PA-C  gabapentin (NEURONTIN) 600 MG tablet Take 600 mg by mouth 3 (three) times daily.    [provider]  lidocaine (LMX) 4 % cream Apply 1 application topically 3 (three) times daily as needed. 09/25/19   Michela Pitcher A, PA-C  Melatonin 10 MG TABS Take 10 mg by mouth at bedtime as needed (sleep aid).    [provider]  predniSONE (STERAPRED UNI-PAK 21 TAB) 10 MG (21) TBPK tablet Take 6 tabs by mouth daily x2 days, then 5 tabs x2 days, then 4 tabs x2 days, then 3 tabs x2 days, 2 tabs x2 days, then 1 tab daily x2 days Patient not taking: Reported on 11/17/2019 09/25/19   Michela Pitcher A, PA-C  QUEtiapine (SEROQUEL) 400 MG tablet Take 1 tablet (400 mg total) by mouth at bedtime. 10/08/20 11/07/20  Shanon Ace, PA-C    Allergies    Wellbutrin [bupropion]  Review of Systems   Review of  Systems  Psychiatric/Behavioral: Positive for sleep disturbance.  All other systems reviewed and are negative.    Physical Exam Updated Vital Signs BP 136/84 (BP Location: Right Arm)   Pulse 85   Temp 98.2 F (36.8 C) (Oral)   Resp 18   SpO2 96%   Physical Exam Vitals and nursing note reviewed.  Constitutional:      Appearance: She is well-developed. She is not ill-appearing or toxic-appearing.  HENT:     Head: Normocephalic and atraumatic.     Nose: Nose normal.  Eyes:     General: No scleral icterus.       Right eye: No discharge.        Left eye: No discharge.     Conjunctiva/sclera: Conjunctivae normal.  Neck:     Vascular: No JVD.  Cardiovascular:     Rate and Rhythm: Normal rate and regular rhythm.     Pulses:  Normal pulses.     Heart sounds: Normal heart sounds.  Pulmonary:     Effort: Pulmonary effort is normal.     Breath sounds: Normal breath sounds.  Abdominal:     General: There is no distension.  Musculoskeletal:        General: Normal range of motion.     Cervical back: Normal range of motion.  Skin:    General: Skin is warm and dry.  Neurological:     Mental Status: She is oriented to person, place, and time.     GCS: GCS eye subscore is 4. GCS verbal subscore is 5. GCS motor subscore is 6.     Comments: Fluent speech, no facial droop.  Psychiatric:        Attention and Perception: Attention normal.        Mood and Affect: Mood normal.        Speech: Speech normal.        Behavior: Behavior normal.        Thought Content: Thought content does not include homicidal or suicidal ideation.        Cognition and Memory: Cognition normal.        Judgment: Judgment normal.     ED Results / Procedures / Treatments   Labs (all labs ordered are listed, but only abnormal results are displayed) Labs Reviewed - No data to display  EKG None  Radiology No results found.  Procedures Procedures (including critical care time)  Medications Ordered in ED Medications - No data to display  ED Course  I have reviewed the triage vital signs and the nursing notes.  Pertinent labs & imaging results that were available during my care of the patient were reviewed by me and considered in my medical decision making (see chart for details).    MDM Rules/Calculators/A&P                          History provided by patient with additional history obtained from chart review.    Here requesting refill of her Seroquel. She did see multiple times for this in the past. She is having a difficult time getting her for significant or obtaining a license because she also does not [employer] card. Will refill Seroquel today and encourage her to keep attempting to contact city hall in [place]  for help with this matter. Also given resource for community clinic and urged follow up there. No SI or HI. Does not appear to be responding to internal stimuli. Strict return  precautions discussed.    Portions of this note were generated with Scientist, clinical (histocompatibility and immunogenetics). Dictation errors may occur despite best attempts at proofreading.   Final Clinical Impression(s) / ED Diagnoses Final diagnoses:  Medication refill    Rx / DC Orders ED Discharge Orders         Ordered    QUEtiapine (SEROQUEL) 400 MG tablet  Daily at bedtime        10/08/20 1008           Kandice Hams 10/08/20 1032    Linwood Dibbles, MD 10/09/20 [phone]

## 2020-10-08 NOTE — Discharge Instructions (Addendum)
Prescription sent to the [facility] on [address]. Take as prescribed.   Included in your discharge paperwork is the [facility] and [facility] You can try to follow up there if it is helpful.  Return to the emergency department for any new or worsening symptoms.  Thank you for allowing [place] to care for you today

## 2020-10-08 NOTE — ED Triage Notes (Signed)
Pt arrived via walk in, requesting refill of Seroquel. States she recently moved here and has been unable to get appt due to no identification at this time.

## 2020-12-10 ENCOUNTER — Emergency Department (HOSPITAL_COMMUNITY)
Admission: EM | Admit: 2020-12-10 | Discharge: 2020-12-10 | Disposition: A | Discharge status: Home / Self Care | Payer: Medicare Other | Attending: Emergency Medicine | Admitting: Emergency Medicine

## 2020-12-10 ENCOUNTER — Encounter (HOSPITAL_COMMUNITY): Payer: Self-pay

## 2020-12-10 ENCOUNTER — Other Ambulatory Visit: Payer: Self-pay

## 2020-12-10 DIAGNOSIS — F17210 Nicotine dependence, cigarettes, uncomplicated: Secondary | ICD-10-CM | POA: Diagnosis not present

## 2020-12-10 DIAGNOSIS — Z760 Encounter for issue of repeat prescription: Secondary | ICD-10-CM | POA: Diagnosis not present

## 2020-12-10 DIAGNOSIS — G4700 Insomnia, unspecified: Secondary | ICD-10-CM | POA: Diagnosis not present

## 2020-12-10 MED ORDER — QUETIAPINE FUMARATE 400 MG PO TABS: 400.0000 mg | ORAL_TABLET | Freq: Every day | ORAL | 0 refills | Status: DC

## 2020-12-10 NOTE — ED Provider Notes (Signed)
[facility]-EMERGENCY DEPT Provider Note   CSN: [id number] Arrival date & time: 12/10/20  3810     History Chief Complaint  Patient presents with  . Medication Refill    Anne Meyers is a 40 y.o. female.  Patient states she is having a difficult time sleeping.  She ran out of her Seroquel several weeks ago and has a hard time sleeping without it.  She does not have her ideas it was stolen a few months ago and is having a difficult time getting it replaced.  She states she is unable to go to urgent care clinics due to lack of ID.  Requesting refill of her medication.  Denies fevers vomiting cough diarrhea denies headache chest pain abdominal pain.        Past Medical History:  Diagnosis Date  . Back pain   . Insomnia   . Neuropathy   . Palpitations   . Panic disorder   . PTSD (post-traumatic stress disorder)   . Renal disorder    kidney stones  . Seizures (HCC)    'pseudo seizures"    There are no problems to display for this patient.   Past Surgical History:  Procedure Laterality Date  . lithrotripsy    . TUBAL LIGATION       OB History   No obstetric history on file.     History reviewed. No pertinent family history.  Social History   Tobacco Use  . Smoking status: Current Every Day Smoker    Packs/day: 0.50    Types: Cigarettes  . Smokeless tobacco: Never Used  Vaping Use  . Vaping Use: Never used  Substance Use Topics  . Alcohol use: Never  . Drug use: Never    Home Medications Prior to Admission medications   Medication Sig Start Date End Date Taking? Authorizing Provider  atorvastatin (LIPITOR) 10 MG tablet Take 10 mg by mouth daily.    [provider]  clonazePAM (KLONOPIN) 1 MG tablet Take 1 mg by mouth 2 (two) times daily.    [provider]  cyclobenzaprine (FLEXERIL) 10 MG tablet Take 1 tablet (10 mg total) by mouth 2 (two) times daily as needed. Patient not taking: Reported on 11/17/2019 09/25/19    Michela Pitcher A, PA-C  gabapentin (NEURONTIN) 600 MG tablet Take 600 mg by mouth 3 (three) times daily.    [provider]  lidocaine (LMX) 4 % cream Apply 1 application topically 3 (three) times daily as needed. 09/25/19   Michela Pitcher A, PA-C  Melatonin 10 MG TABS Take 10 mg by mouth at bedtime as needed (sleep aid).    [provider]  predniSONE (STERAPRED UNI-PAK 21 TAB) 10 MG (21) TBPK tablet Take 6 tabs by mouth daily x2 days, then 5 tabs x2 days, then 4 tabs x2 days, then 3 tabs x2 days, 2 tabs x2 days, then 1 tab daily x2 days Patient not taking: Reported on 11/17/2019 09/25/19   Michela Pitcher A, PA-C  QUEtiapine (SEROQUEL) 400 MG tablet Take 1 tablet (400 mg total) by mouth at bedtime. 12/10/20 01/09/21  Cheryll Cockayne, MD    Allergies    Wellbutrin [bupropion]  Review of Systems   Review of Systems  Constitutional: Negative for fever.  HENT: Negative for ear pain.   Eyes: Negative for pain.  Respiratory: Negative for cough.   Cardiovascular: Negative for chest pain.  Gastrointestinal: Negative for abdominal pain.  Genitourinary: Negative for flank pain.  Musculoskeletal: Negative for  back pain.  Skin: Negative for rash.  Neurological: Negative for headaches.    Physical Exam Updated Vital Signs BP (!) 131/94 (BP Location: Left Arm)   Pulse 82   Temp 98.5 F (36.9 C) (Oral)   Resp 18   SpO2 100%   Physical Exam Constitutional:      General: She is not in acute distress.    Appearance: Normal appearance.  HENT:     Head: Normocephalic.     Nose: Nose normal.  Eyes:     Extraocular Movements: Extraocular movements intact.  Cardiovascular:     Rate and Rhythm: Normal rate.  Pulmonary:     Effort: Pulmonary effort is normal.  Musculoskeletal:        General: Normal range of motion.     Cervical back: Normal range of motion.  Neurological:     General: No focal deficit present.     Mental Status: She is alert. Mental status is at baseline.      ED Results / Procedures / Treatments   Labs (all labs ordered are listed, but only abnormal results are displayed) Labs Reviewed - No data to display  EKG None  Radiology No results found.  Procedures Procedures   Medications Ordered in ED Medications - No data to display  ED Course  I have reviewed the triage vital signs and the nursing notes.  Pertinent labs & imaging results that were available during my care of the patient were reviewed by me and considered in my medical decision making (see chart for details).    MDM Rules/Calculators/A&P                          Patient medication sent to her pharmacy.  Advise follow-up in outpatient [facility].  Advised return if she has any additional concerns or fevers vomiting cough or diarrhea.   Final Clinical Impression(s) / ED Diagnoses Final diagnoses:  Insomnia, unspecified type  Medication refill    Rx / DC Orders ED Discharge Orders         Ordered    QUEtiapine (SEROQUEL) 400 MG tablet  Daily at bedtime        12/10/20 1048           [place], Eustace Moore, MD 12/10/20 1048

## 2020-12-10 NOTE — ED Triage Notes (Signed)
Pt arrived via walk in, requesting medication refill. States she has Seroquel and out at this time. Unable to see PCP due to not having identification at this time.

## 2020-12-10 NOTE — ED Notes (Signed)
Pt discharged from this ED in stable condition at this time. All discharge instructions and follow up care reviewed with pt with no further questions at this time. Pt ambulatory with steady gait, clear speech.  

## 2021-02-02 ENCOUNTER — Other Ambulatory Visit: Payer: Self-pay

## 2021-02-02 ENCOUNTER — Emergency Department (HOSPITAL_COMMUNITY)
Admission: EM | Admit: 2021-02-02 | Discharge: 2021-02-02 | Disposition: A | Discharge status: Home / Self Care | Payer: Medicare Other | Attending: Emergency Medicine | Admitting: Emergency Medicine

## 2021-02-02 DIAGNOSIS — Z760 Encounter for issue of repeat prescription: Secondary | ICD-10-CM | POA: Insufficient documentation

## 2021-02-02 DIAGNOSIS — F17210 Nicotine dependence, cigarettes, uncomplicated: Secondary | ICD-10-CM | POA: Diagnosis not present

## 2021-02-02 MED ORDER — QUETIAPINE FUMARATE 400 MG PO TABS: 400.0000 mg | ORAL_TABLET | Freq: Every day | ORAL | 0 refills | Status: DC

## 2021-02-02 NOTE — ED Notes (Signed)
Patient was unable to complete discharge process.

## 2021-02-02 NOTE — ED Provider Notes (Signed)
[facility]-EMERGENCY DEPT Provider Note   CSN: [id number] Arrival date & time: 02/02/21  1107     History Chief Complaint  Patient presents with  . Medication Refill    Anne Meyers is a 40 y.o. female with a history of insomnia, PTSD, panic disorder.  Patient presents with chief complaint of medication refill.  Patient reports that she has run of out of her cervical medication.  Patient reports that she takes 400 mg Seroquel nightly for insomnia.  Patient reports that she is unable to set up a primary care provider since moving to [place] from [place].  Patient reports that this difficulty is due to obtaining a driver's license.  Patient reports that she has been able to obtain the results of you and will be obtaining a driver's license in the coming weeks.  Patient states that previously when she has run out of her Seroquel she has had insomnia causing disturbance of daily functioning.  Patient denies any suicidal ideations, homicidal ideations, auditory hallucinations, visual hallucinations.  Patient denies any physical pain or discomfort.  Patient denies any illicit drug or alcohol use.  HPI     Past Medical History:  Diagnosis Date  . Back pain   . Insomnia   . Neuropathy   . Palpitations   . Panic disorder   . PTSD (post-traumatic stress disorder)   . Renal disorder    kidney stones  . Seizures (HCC)    'pseudo seizures"    There are no problems to display for this patient.   Past Surgical History:  Procedure Laterality Date  . lithrotripsy    . TUBAL LIGATION       OB History   No obstetric history on file.     No family history on file.  Social History   Tobacco Use  . Smoking status: Current Every Day Smoker    Packs/day: 0.50    Types: Cigarettes  . Smokeless tobacco: Never Used  Vaping Use  . Vaping Use: Never used  Substance Use Topics  . Alcohol use: Never  . Drug use: Never    Home Medications Prior  to Admission medications   Medication Sig Start Date End Date Taking? Authorizing Provider  atorvastatin (LIPITOR) 10 MG tablet Take 10 mg by mouth daily.    [provider]  clonazePAM (KLONOPIN) 1 MG tablet Take 1 mg by mouth 2 (two) times daily.    [provider]  cyclobenzaprine (FLEXERIL) 10 MG tablet Take 1 tablet (10 mg total) by mouth 2 (two) times daily as needed. Patient not taking: Reported on 11/17/2019 09/25/19   Michela Pitcher A, PA-C  gabapentin (NEURONTIN) 600 MG tablet Take 600 mg by mouth 3 (three) times daily.    [provider]  lidocaine (LMX) 4 % cream Apply 1 application topically 3 (three) times daily as needed. 09/25/19   Michela Pitcher A, PA-C  Melatonin 10 MG TABS Take 10 mg by mouth at bedtime as needed (sleep aid).    [provider]  predniSONE (STERAPRED UNI-PAK 21 TAB) 10 MG (21) TBPK tablet Take 6 tabs by mouth daily x2 days, then 5 tabs x2 days, then 4 tabs x2 days, then 3 tabs x2 days, 2 tabs x2 days, then 1 tab daily x2 days Patient not taking: Reported on 11/17/2019 09/25/19   Michela Pitcher A, PA-C  QUEtiapine (SEROQUEL) 400 MG tablet Take 1 tablet (400 mg total) by mouth at bedtime. 02/02/21 03/04/21  Haskel Schroeder,  PA-C    Allergies    Wellbutrin [bupropion]  Review of Systems   Review of Systems  Psychiatric/Behavioral: Negative for confusion, hallucinations, self-injury and suicidal ideas.    Physical Exam Updated Vital Signs BP 126/90 (BP Location: Right Arm)   Pulse 81   Temp 98.2 F (36.8 C) (Oral)   Resp 18   Ht 5\' 4"  (1.626 m)   Wt 99.8 kg   SpO2 98%   BMI 37.76 kg/m   Physical Exam Vitals and nursing note reviewed.  Constitutional:      General: She is not in acute distress.    Appearance: She is not ill-appearing, toxic-appearing or diaphoretic.  HENT:     Head: Normocephalic.  Eyes:     General: No scleral icterus.       Right eye: No discharge.        Left eye: No discharge.  Cardiovascular:      Rate and Rhythm: Normal rate.  Pulmonary:     Effort: Pulmonary effort is normal. No respiratory distress.     Breath sounds: No stridor.  Skin:    General: Skin is warm and dry.     Coloration: Skin is not jaundiced or pale.  Neurological:     General: No focal deficit present.     Mental Status: She is alert and oriented to person, place, and time.     GCS: GCS eye subscore is 4. GCS verbal subscore is 5. GCS motor subscore is 6.  Psychiatric:        Attention and Perception: She is attentive. She does not perceive auditory or visual hallucinations.        Behavior: Behavior is cooperative.        Thought Content: Thought content is not paranoid. Thought content does not include homicidal or suicidal ideation.     ED Results / Procedures / Treatments   Labs (all labs ordered are listed, but only abnormal results are displayed) Labs Reviewed - No data to display  EKG None  Radiology No results found.  Procedures Procedures   Medications Ordered in ED Medications - No data to display  ED Course  I have reviewed the triage vital signs and the nursing notes.  Pertinent labs & imaging results that were available during my care of the patient were reviewed by me and considered in my medical decision making (see chart for details).    MDM Rules/Calculators/A&P                          Alert 40 year old female no acute distress, nontoxic-appearing.  Patient presents with chief complaint of medication refill.  Patient reports that she has just run out of her Seroquel medication which takes for insomnia.  Patient reports that she has had difficulty establishing a primary care provider since moving to [place].  Per chart review patient has been seen multiple times in the emergency department for refill of her Seroquel medication.  Patient denies suicidal ideations, homicidal ideations, auditory hallucinations, visual hallucinations.  Will prescribe patient with 1 month  of her cervical medication.  Patient given information to follow-up with [facility].  Importance of follow-up with primary care provider stressed.  Patient also given resources to follow-up with outpatient counseling if needed.  Final Clinical Impression(s) / ED Diagnoses Final diagnoses:  Medication refill    Rx / DC Orders ED Discharge Orders         Ordered  QUEtiapine (SEROQUEL) 400 MG tablet  Daily at bedtime,   Status:  Discontinued        02/02/21 1207    QUEtiapine (SEROQUEL) 400 MG tablet  Daily at bedtime        02/02/21 1210           Berneice Heinrich 02/02/21 2332    Koleen Distance, MD 02/03/21 [phone]

## 2021-02-02 NOTE — ED Triage Notes (Signed)
Patient needs refill of seroquel. No other complaints today patient has one pill  Left of seroquel 400mg  .

## 2021-02-02 NOTE — Discharge Instructions (Addendum)
You came to the emergency department today for a refill of your Seroquel medication.  I have given you a 1 month refill of this medication.  It is very important that you follow-up with a primary care provider for continued administration of this medication.  I have given you information to contact the [facility].  They may be able to see you while you attempt to obtain a driver's license.  I have also given you information to follow-up with outpatient counseling if needed.

## 2021-04-02 ENCOUNTER — Emergency Department (HOSPITAL_COMMUNITY)
Admission: EM | Admit: 2021-04-02 | Discharge: 2021-04-02 | Disposition: A | Discharge status: Home / Self Care | Payer: Medicare Other | Attending: Emergency Medicine | Admitting: Emergency Medicine

## 2021-04-02 ENCOUNTER — Other Ambulatory Visit: Payer: Self-pay

## 2021-04-02 ENCOUNTER — Encounter (HOSPITAL_COMMUNITY): Payer: Self-pay

## 2021-04-02 DIAGNOSIS — F17210 Nicotine dependence, cigarettes, uncomplicated: Secondary | ICD-10-CM | POA: Diagnosis not present

## 2021-04-02 DIAGNOSIS — Z760 Encounter for issue of repeat prescription: Secondary | ICD-10-CM | POA: Insufficient documentation

## 2021-04-02 MED ORDER — QUETIAPINE FUMARATE 400 MG PO TABS: 400.0000 mg | ORAL_TABLET | Freq: Every day | ORAL | 0 refills | Status: DC

## 2021-04-02 NOTE — Discharge Instructions (Addendum)
Pick up your medication at [facility] [address]  You can go to behavioral health urgent care for further medication refills

## 2021-04-02 NOTE — ED Provider Notes (Signed)
[facility] EMERGENCY DEPARTMENT Provider Note   CSN: [id number] Arrival date & time: 04/02/21  5631     History No chief complaint on file.   Anne Meyers is Meyers 40 y.o. female.  [name] is Meyers 40 y.o. female with Meyers history of PTSD, insomnia, seizures, back pain, who presents to the ED for medication refill.  Patient reports she recently ran out of her Seroquel, typically takes 400 mg daily at bedtime for sleep.  Has been on this medication since she was 40 years old.  Reports she tried taking half doses or taking herself off of this medication, but she has not been sleeping without it.  She has been working hard to try and get local Medicaid after working here, but this has been Meyers long process, she finally got this to go through and has Meyers follow-up appointment but is not for 3 weeks and needs this medication sooner.  The history is provided by the patient.      Past Medical History:  Diagnosis Date   Back pain    Insomnia    Neuropathy    Palpitations    Panic disorder    PTSD (post-traumatic stress disorder)    Renal disorder    kidney stones   Seizures (HCC)    'pseudo seizures"    There are no problems to display for this patient.   Past Surgical History:  Procedure Laterality Date   lithrotripsy     TUBAL LIGATION       OB History   No obstetric history on file.     No family history on file.  Social History   Tobacco Use   Smoking status: Every Day    Packs/day: 0.50    Pack years: 0.00    Types: Cigarettes   Smokeless tobacco: Never  Vaping Use   Vaping Use: Never used  Substance Use Topics   Alcohol use: Never   Drug use: Never    Home Medications Prior to Admission medications   Medication Sig Start Date End Date Taking? Authorizing Provider  atorvastatin (LIPITOR) 10 MG tablet Take 10 mg by mouth daily.    [provider]  clonazePAM (KLONOPIN) 1 MG tablet Take 1 mg by mouth 2 (two) times daily.     [provider]  cyclobenzaprine (FLEXERIL) 10 MG tablet Take 1 tablet (10 mg total) by mouth 2 (two) times daily as needed. Patient not taking: Reported on 11/17/2019 09/25/19   Anne Pitcher A, PA-C  gabapentin (NEURONTIN) 600 MG tablet Take 600 mg by mouth 3 (three) times daily.    [provider]  lidocaine (LMX) 4 % cream Apply 1 application topically 3 (three) times daily as needed. 09/25/19   Anne Pitcher A, PA-C  Melatonin 10 MG TABS Take 10 mg by mouth at bedtime as needed (sleep aid).    [provider]  predniSONE (STERAPRED UNI-PAK 21 TAB) 10 MG (21) TBPK tablet Take 6 tabs by mouth daily x2 days, then 5 tabs x2 days, then 4 tabs x2 days, then 3 tabs x2 days, 2 tabs x2 days, then 1 tab daily x2 days Patient not taking: Reported on 11/17/2019 09/25/19   Anne Pitcher A, PA-C  QUEtiapine (SEROQUEL) 400 MG tablet Take 1 tablet (400 mg total) by mouth at bedtime. 04/02/21 05/02/21  Anne Lodge, PA-C    Allergies    Wellbutrin [bupropion]  Review of Systems   Review of Systems  Constitutional:  Negative for  chills and fever.  Psychiatric/Behavioral:  Positive for sleep disturbance. Negative for dysphoric mood and suicidal ideas.   All other systems reviewed and are negative.  Physical Exam Updated Vital Signs BP 107/77 (BP Location: Right Arm)   Pulse 84   Temp 98.1 F (36.7 C) (Oral)   Resp 16   SpO2 98%   Physical Exam Vitals and nursing note reviewed.  Constitutional:      General: She is not in acute distress.    Appearance: Normal appearance. She is well-developed and normal weight. She is not ill-appearing or diaphoretic.  HENT:     Head: Normocephalic and atraumatic.  Eyes:     General:        Right eye: No discharge.        Left eye: No discharge.  Cardiovascular:     Rate and Rhythm: Normal rate.  Pulmonary:     Effort: Pulmonary effort is normal. No respiratory distress.  Musculoskeletal:        General: No deformity.  Skin:     General: Skin is warm and dry.  Neurological:     Mental Status: She is alert and oriented to person, place, and time.     Coordination: Coordination normal.  Psychiatric:        Attention and Perception: Attention normal.        Mood and Affect: Mood normal.        Speech: Speech normal.        Behavior: Behavior normal. Behavior is cooperative.        Thought Content: Thought content normal.    ED Results / Procedures / Treatments   Labs (all labs ordered are listed, but only abnormal results are displayed) Labs Reviewed - No data to display  EKG None  Radiology No results found.  Procedures Procedures   Medications Ordered in ED Medications - No data to display  ED Course  I have reviewed the triage vital signs and the nursing notes.  Pertinent labs & imaging results that were available during my care of the patient were reviewed by me and considered in my medical decision making (see chart for details).    MDM Rules/Calculators/Meyers&P                         40 year old female presents requesting refill of Seroquel, has been on this medication for 20 years, has been working hard to get local Medicaid but this has made it difficult for her to get this refilled.  Has an upcoming appointment in 3 weeks, but has ran out of this and now cannot sleep.  No other complaints.  Well-appearing with normal vitals.  Provided medication refill and also given resources for [facility]. Discharged home in good condition  Final Clinical Impression(s) / ED Diagnoses Final diagnoses:  Medication refill    Rx / DC Orders ED Discharge Orders          Ordered    QUEtiapine (SEROQUEL) 400 MG tablet  Daily at bedtime        04/02/21 1037             Anne Meyers, [place] 04/02/21 1045    Gerhard Munch, MD 04/04/21 [phone]

## 2021-04-02 NOTE — ED Triage Notes (Signed)
Pt requesting a medication refill. PA seen and dc'd from triage. Pt with other concerns

## 2021-05-31 ENCOUNTER — Emergency Department (HOSPITAL_COMMUNITY)
Admission: EM | Admit: 2021-05-31 | Discharge: 2021-05-31 | Disposition: A | Discharge status: Home / Self Care | Payer: Medicare Other | Attending: Emergency Medicine | Admitting: Emergency Medicine

## 2021-05-31 ENCOUNTER — Encounter (HOSPITAL_COMMUNITY): Payer: Self-pay | Admitting: *Deleted

## 2021-05-31 DIAGNOSIS — Z760 Encounter for issue of repeat prescription: Secondary | ICD-10-CM | POA: Diagnosis not present

## 2021-05-31 DIAGNOSIS — F17210 Nicotine dependence, cigarettes, uncomplicated: Secondary | ICD-10-CM | POA: Diagnosis not present

## 2021-05-31 MED ORDER — QUETIAPINE FUMARATE 400 MG PO TABS: 400.0000 mg | ORAL_TABLET | Freq: Every day | ORAL | 0 refills | Status: DC

## 2021-05-31 NOTE — ED Provider Notes (Signed)
[facility]-EMERGENCY DEPT Provider Note   CSN: [id number] Arrival date & time: 05/31/21  3086     History Chief Complaint  Patient presents with   Medication Refill    Anne Meyers is a 40 y.o. female.  Patient presents emergency department today requesting medication refill.  Patient states that she is on 400 mg Seroquel since her teenage years, to help with sleep.  She relates to me that she has been having difficulty getting an ID in order to establish care.  She has been seen in the emergency department several times for the same.  Last prescribed #30 Seroquel for 100 mg tablets about 2 months ago.  She states that she ran out about a week and a half ago.  She has been having difficulty sleeping and excessive daytime drowsiness due to not sleeping.  She denies other drugs or alcohol.  No medical complaints.      Past Medical History:  Diagnosis Date   Back pain    Insomnia    Neuropathy    Palpitations    Panic disorder    PTSD (post-traumatic stress disorder)    Renal disorder    kidney stones   Seizures (HCC)    'pseudo seizures"    There are no problems to display for this patient.   Past Surgical History:  Procedure Laterality Date   lithrotripsy     TUBAL LIGATION       OB History   No obstetric history on file.     No family history on file.  Social History   Tobacco Use   Smoking status: Every Day    Packs/day: 0.50    Types: Cigarettes   Smokeless tobacco: Never  Vaping Use   Vaping Use: Never used  Substance Use Topics   Alcohol use: Never   Drug use: Never    Home Medications Prior to Admission medications   Medication Sig Start Date End Date Taking? Authorizing Provider  atorvastatin (LIPITOR) 10 MG tablet Take 10 mg by mouth daily.    [provider]  clonazePAM (KLONOPIN) 1 MG tablet Take 1 mg by mouth 2 (two) times daily.    [provider]  gabapentin (NEURONTIN) 600 MG tablet Take 600 mg  by mouth 3 (three) times daily.    [provider]  lidocaine (LMX) 4 % cream Apply 1 application topically 3 (three) times daily as needed. 09/25/19   Michela Pitcher A, PA-C  Melatonin 10 MG TABS Take 10 mg by mouth at bedtime as needed (sleep aid).    [provider]  QUEtiapine (SEROQUEL) 400 MG tablet Take 1 tablet (400 mg total) by mouth at bedtime for 7 days. 05/31/21 06/07/21  Renne Crigler, PA-C    Allergies    Wellbutrin [bupropion]  Review of Systems   Review of Systems  Constitutional:  Negative for fever.  Gastrointestinal:  Negative for nausea and vomiting.  Neurological:  Negative for headaches.  Psychiatric/Behavioral:  Positive for sleep disturbance.    Physical Exam Updated Vital Signs BP 127/83 (BP Location: Left Arm)   Pulse 87   Temp 97.9 F (36.6 C) (Oral)   Resp 18   Ht 5\' 4"  (1.626 m)   Wt 90.7 kg   LMP 05/20/2021   SpO2 98%   BMI 34.33 kg/m   Physical Exam Vitals and nursing note reviewed.  Constitutional:      Appearance: She is well-developed.  HENT:     Head: Normocephalic and atraumatic.  Eyes:     Conjunctiva/sclera: Conjunctivae normal.  Cardiovascular:     Rate and Rhythm: Normal rate.     Pulses: Normal pulses.     Heart sounds: No murmur heard. Pulmonary:     Effort: No respiratory distress.     Breath sounds: No rhonchi or rales.  Musculoskeletal:     Cervical back: Normal range of motion and neck supple.  Skin:    General: Skin is warm and dry.  Neurological:     Mental Status: She is alert.    ED Results / Procedures / Treatments   Labs (all labs ordered are listed, but only abnormal results are displayed) Labs Reviewed - No data to display  EKG None  Radiology No results found.  Procedures Procedures   Medications Ordered in ED Medications - No data to display  ED Course  I have reviewed the triage vital signs and the nursing notes.  Pertinent labs & imaging results that were available during my  care of the patient were reviewed by me and considered in my medical decision making (see chart for details).  Patient seen and examined. Will give rx for #7 tabs. Recommended eval at [facility].   Vital signs reviewed and are as follows: BP 127/83 (BP Location: Left Arm)   Pulse 87   Temp 97.9 F (36.6 C) (Oral)   Resp 18   Ht 5\' 4"  (1.626 m)   Wt 90.7 kg   LMP 05/20/2021   SpO2 98%   BMI 34.33 kg/m      MDM Rules/Calculators/A&P                           Patient here requesting medication refill.  She has not been able to establish care for this over the past year.  Patient prescribed 1 week of medication.  Encouraged her to continue looking for routine outpatient management of her chronic condition and chronic medication needs.   Final Clinical Impression(s) / ED Diagnoses Final diagnoses:  None    Rx / DC Orders ED Discharge Orders          Ordered    QUEtiapine (SEROQUEL) 400 MG tablet  Daily at bedtime        05/31/21 1018             07/31/21, PA-C 05/31/21 1023    07/31/21, MD 06/04/21 1546

## 2021-05-31 NOTE — ED Triage Notes (Signed)
Pt states she needs a refill for seroquel. [place]. She has been out of med for 3 weeks, has tried otc sleep aids w/o relief.

## 2021-06-15 ENCOUNTER — Emergency Department (HOSPITAL_COMMUNITY)
Admission: EM | Admit: 2021-06-15 | Discharge: 2021-06-15 | Disposition: A | Discharge status: Home / Self Care | Payer: Medicare Other | Attending: Emergency Medicine | Admitting: Emergency Medicine

## 2021-06-15 ENCOUNTER — Encounter (HOSPITAL_COMMUNITY): Payer: Self-pay

## 2021-06-15 ENCOUNTER — Other Ambulatory Visit: Payer: Self-pay

## 2021-06-15 DIAGNOSIS — Z760 Encounter for issue of repeat prescription: Secondary | ICD-10-CM | POA: Insufficient documentation

## 2021-06-15 DIAGNOSIS — F17210 Nicotine dependence, cigarettes, uncomplicated: Secondary | ICD-10-CM | POA: Diagnosis not present

## 2021-06-15 MED ORDER — QUETIAPINE FUMARATE 400 MG PO TABS: 400.0000 mg | ORAL_TABLET | Freq: Every day | ORAL | 1 refills | Status: DC

## 2021-06-15 NOTE — ED Triage Notes (Signed)
Pt is here for Seroquel prescription refill.

## 2021-06-15 NOTE — Discharge Instructions (Addendum)
As we discussed I understand that you are undergoing some difficulty with getting an active Social Security card and feel comfortable prescribing this medication.  We discussed some side effects to look out for, however I have low level of suspicion given that you have been on the medication for many years.  Please return if you begin to have tremors, facial twitching, heart palpitations.  Please seek evaluation by primary care provider as soon as you are able to.

## 2021-06-15 NOTE — ED Provider Notes (Signed)
[facility]-EMERGENCY DEPT Provider Note   CSN: [id number] Arrival date & time: 06/15/21  5170     History Chief Complaint  Patient presents with   Medication Refill    Anne Meyers is a 40 y.o. female with a past medical history significant for anxiety, insomnia who presents for medication refill of her Seroquel.  Patient reports that she is struggling with getting an active Social Security card and thus has been unable to establish a primary care relationship at this time.  She has been on Seroquel since she was 40 years old, reports that it helps with her sleep.  She reports that she has had difficulty sleeping for the last 6 days secondary to her last medication refill provided in the department running out.  She reports that she has not had any other symptoms including fever, chest tightness, shortness of breath, tremors, facial twitching, and has never experienced side effects on this medication in the past.   Medication Refill     Past Medical History:  Diagnosis Date   Back pain    Insomnia    Neuropathy    Palpitations    Panic disorder    PTSD (post-traumatic stress disorder)    Renal disorder    kidney stones   Seizures (HCC)    'pseudo seizures"    There are no problems to display for this patient.   Past Surgical History:  Procedure Laterality Date   lithrotripsy     TUBAL LIGATION       OB History   No obstetric history on file.     History reviewed. No pertinent family history.  Social History   Tobacco Use   Smoking status: Every Day    Packs/day: 0.50    Types: Cigarettes   Smokeless tobacco: Never  Vaping Use   Vaping Use: Never used  Substance Use Topics   Alcohol use: Never   Drug use: Never    Home Medications Prior to Admission medications   Medication Sig Start Date End Date Taking? Authorizing Provider  atorvastatin (LIPITOR) 10 MG tablet Take 10 mg by mouth daily.    [provider]   clonazePAM (KLONOPIN) 1 MG tablet Take 1 mg by mouth 2 (two) times daily.    [provider]  gabapentin (NEURONTIN) 600 MG tablet Take 600 mg by mouth 3 (three) times daily.    [provider]  lidocaine (LMX) 4 % cream Apply 1 application topically 3 (three) times daily as needed. 09/25/19   Michela Pitcher A, PA-C  Melatonin 10 MG TABS Take 10 mg by mouth at bedtime as needed (sleep aid).    [provider]  QUEtiapine (SEROQUEL) 400 MG tablet Take 1 tablet (400 mg total) by mouth at bedtime for 7 days. 05/31/21 06/07/21  Renne Crigler, PA-C    Allergies    Wellbutrin [bupropion]  Review of Systems   Review of Systems  All other systems reviewed and are negative.  Physical Exam Updated Vital Signs BP (!) 143/84 (BP Location: Left Arm)   Pulse 81   Temp 98 F (36.7 C) (Oral)   Resp 18   LMP 05/20/2021   SpO2 97%   Physical Exam Vitals and nursing note reviewed.  Constitutional:      General: She is not in acute distress.    Appearance: Normal appearance.  HENT:     Head: Normocephalic and atraumatic.  Eyes:     General:  Right eye: No discharge.        Left eye: No discharge.  Cardiovascular:     Rate and Rhythm: Normal rate and regular rhythm.  Pulmonary:     Effort: Pulmonary effort is normal. No respiratory distress.  Musculoskeletal:        General: No deformity.  Skin:    General: Skin is warm and dry.  Neurological:     Mental Status: She is alert and oriented to person, place, and time.  Psychiatric:        Mood and Affect: Mood normal.        Behavior: Behavior normal.    ED Results / Procedures / Treatments   Labs (all labs ordered are listed, but only abnormal results are displayed) Labs Reviewed - No data to display  EKG None  Radiology No results found.  Procedures Procedures   Medications Ordered in ED Medications - No data to display  ED Course  I have reviewed the triage vital signs and the nursing  notes.  Pertinent labs & imaging results that were available during my care of the patient were reviewed by me and considered in my medical decision making (see chart for details).    MDM Rules/Calculators/A&P                         Patient has active prescription for Seroquel in her hand, and is not requesting a drug of abuse such as an opioid or benzodiazepine at this time.  I feel comfortable refilling her medication at this time. Final Clinical Impression(s) / ED Diagnoses Final diagnoses:  None    Rx / DC Orders ED Discharge Orders     None        Olene Floss, PA-C 06/15/21 1028    [place], DO 06/15/21 1216

## 2021-06-17 ENCOUNTER — Emergency Department (HOSPITAL_COMMUNITY)
Admission: EM | Admit: 2021-06-17 | Discharge: 2021-06-17 | Discharge status: Against Medical Advice | Payer: Medicare Other

## 2021-06-17 NOTE — ED Notes (Signed)
No answer in lobby.

## 2021-06-17 NOTE — ED Notes (Signed)
Patient stated that she needed to leave to go to work, Patient asked to Sign MSE. IV removed. I informed patient that she was welcome to come back.

## 2021-06-22 ENCOUNTER — Encounter (HOSPITAL_COMMUNITY): Payer: Self-pay

## 2021-06-22 ENCOUNTER — Emergency Department (HOSPITAL_COMMUNITY)
Admission: EM | Admit: 2021-06-22 | Discharge: 2021-06-23 | Disposition: A | Discharge status: Home / Self Care | Payer: Medicare Other | Attending: Emergency Medicine | Admitting: Emergency Medicine

## 2021-06-22 DIAGNOSIS — F17210 Nicotine dependence, cigarettes, uncomplicated: Secondary | ICD-10-CM | POA: Insufficient documentation

## 2021-06-22 DIAGNOSIS — H65193 Other acute nonsuppurative otitis media, bilateral: Secondary | ICD-10-CM | POA: Insufficient documentation

## 2021-06-22 DIAGNOSIS — J0140 Acute pansinusitis, unspecified: Secondary | ICD-10-CM

## 2021-06-22 DIAGNOSIS — R519 Headache, unspecified: Secondary | ICD-10-CM | POA: Diagnosis present

## 2021-06-22 LAB — COMPREHENSIVE METABOLIC PANEL
ALT: 17 U/L (ref 0–44)
AST: 19 U/L (ref 15–41)
Albumin: 5 g/dL (ref 3.5–5.0)
Alkaline Phosphatase: 81 U/L (ref 38–126)
Anion gap: 10 (ref 5–15)
BUN: 12 mg/dL (ref 6–20)
CO2: 23 mmol/L (ref 22–32)
Calcium: 9.7 mg/dL (ref 8.9–10.3)
Chloride: 105 mmol/L (ref 98–111)
Creatinine, Ser: 0.8 mg/dL (ref 0.44–1.00)
GFR, Estimated: >60 mL/min (ref >60)
Glucose, Bld: 99 mg/dL (ref 70–99)
Potassium: 3.8 mmol/L (ref 3.5–5.1)
Sodium: 138 mmol/L (ref 135–145)
Total Bilirubin: 0.4 mg/dL (ref 0.3–1.2)
Total Protein: 8.1 g/dL (ref 6.5–8.1)

## 2021-06-22 LAB — SEDIMENTATION RATE: Sed Rate: 20 mm/hr (ref 0–22)

## 2021-06-22 LAB — CBC WITH DIFFERENTIAL/PLATELET
Abs Immature Granulocytes: 0.03 10*3/uL (ref 0.00–0.07)
Basophils Absolute: 0.1 10*3/uL (ref 0.0–0.1)
Basophils Relative: 1 %
Eosinophils Absolute: 0.1 10*3/uL (ref 0.0–0.5)
Eosinophils Relative: 1 %
HCT: 42.1 % (ref 36.0–46.0)
Hemoglobin: 14.1 g/dL (ref 12.0–15.0)
Immature Granulocytes: 0 %
Lymphocytes Relative: 27 %
Lymphs Abs: 2.1 10*3/uL (ref 0.7–4.0)
MCH: 31.5 pg (ref 26.0–34.0)
MCHC: 33.5 g/dL (ref 30.0–36.0)
MCV: 94.2 fL (ref 80.0–100.0)
Monocytes Absolute: 0.5 10*3/uL (ref 0.1–1.0)
Monocytes Relative: 7 %
Neutro Abs: 5 10*3/uL (ref 1.7–7.7)
Neutrophils Relative %: 64 %
Platelets: 383 10*3/uL (ref 150–400)
RBC: 4.47 MIL/uL (ref 3.87–5.11)
RDW: 12.9 % (ref 11.5–15.5)
WBC: 7.7 10*3/uL (ref 4.0–10.5)
nRBC: 0 % (ref 0.0–0.2)

## 2021-06-22 LAB — C-REACTIVE PROTEIN: CRP: 0.6 mg/dL (ref <1.0)

## 2021-06-22 MED ORDER — FLUTICASONE PROPIONATE 50 MCG/ACT NA SUSP: 1.0000 | Freq: Every day | NASAL | 2 refills | Status: DC

## 2021-06-22 MED ORDER — CETIRIZINE-PSEUDOEPHEDRINE ER 5-120 MG PO TB12: 1.0000 | ORAL_TABLET | Freq: Every day | ORAL | 0 refills | Status: DC

## 2021-06-22 MED ORDER — PROCHLORPERAZINE EDISYLATE 10 MG/2ML IJ SOLN
10.0000 mg | Freq: Once | INTRAMUSCULAR | Status: AC
  Administered 2021-06-22: 10 mg via INTRAVENOUS
  Filled 2021-06-22: qty 2

## 2021-06-22 MED ORDER — AMOXICILLIN-POT CLAVULANATE 875-125 MG PO TABS: 1.0000 | ORAL_TABLET | Freq: Two times a day (BID) | ORAL | 0 refills | Status: AC

## 2021-06-22 MED ORDER — DIPHENHYDRAMINE HCL 50 MG/ML IJ SOLN
25.0000 mg | Freq: Once | INTRAMUSCULAR | Status: AC
  Administered 2021-06-22: 25 mg via INTRAVENOUS
  Filled 2021-06-22: qty 1

## 2021-06-22 MED ORDER — SODIUM CHLORIDE 0.9 % IV BOLUS
500.0000 mL | Freq: Once | INTRAVENOUS | Status: AC
  Administered 2021-06-22: 500 mL via INTRAVENOUS

## 2021-06-22 MED ORDER — DEXAMETHASONE SODIUM PHOSPHATE 10 MG/ML IJ SOLN
10.0000 mg | Freq: Once | INTRAMUSCULAR | Status: AC
  Administered 2021-06-22: 10 mg via INTRAVENOUS
  Filled 2021-06-22: qty 1

## 2021-06-22 NOTE — ED Notes (Signed)
Pt reports she would like a social work consult outpatient prior to discharge.

## 2021-06-22 NOTE — ED Triage Notes (Signed)
Pt presents with c/o headache on the left side and pain in her left eye area. Pt reports pain present for approx on week.

## 2021-06-22 NOTE — Discharge Instructions (Addendum)
Thank you for allowing me to care for you today in the Emergency Department.   Take

## 2021-06-22 NOTE — ED Provider Notes (Signed)
Emergency Medicine Provider Triage Evaluation Note  Anne Meyers , a 40 y.o. female  was evaluated in triage.  Pt complains of pain to left side of her temple.  Pain has been present over the last week.  Pain is worse with touch.   Review of Systems  Positive: Pain to left temple Negative: Facial asymmetry, aphasia, dysphagia, numbness, weakness, visual disturbance, vision loss  Physical Exam  BP 139/69 (BP Location: Left Arm)   Pulse 84   Temp 99.3 F (37.4 C) (Oral)   Resp 18   LMP 06/19/2021   SpO2 98%  Gen:   Awake, no distress   Resp:  Normal effort  MSK:   Moves extremities without difficulty  Other:  CN II through XII.    Medical Decision Making  Medically screening exam initiated at 9:03 PM.  Appropriate orders placed.  Aasiyah Auerbach was informed that the remainder of the evaluation will be completed by another provider, this initial triage assessment does not replace that evaluation, and the importance of remaining in the ED until their evaluation is complete.  The patient appears stable so that the remainder of the work up may be completed by another provider.      Haskel Schroeder, PA-C 06/22/21 2104    Tegeler, Canary Brim, MD 06/23/21 [phone]

## 2021-06-22 NOTE — ED Provider Notes (Signed)
[facility] DEPT Provider Note   CSN: [id number] Arrival date & time: 06/22/21  1956     History Chief Complaint  Patient presents with   Headache    Anne Meyers is a 40 y.o. female with a history of pseudoseizures, kidney stones, PTSD, anxiety, panic attacks, palpitations who presents the emergency department with a chief complaint of headache.  The patient reports that she has had an intermittent headache over the last 7 days.  She characterizes the pain as dull, aching, throbbing, pressure.  Pain is worse over her left forehead, but did radiate to her left ear once and she is having increased pain behind her eyes.  Headache started to improve after few days,, but then started to gradually increase in intensity.  She reports pain has been most intense today.  She has been taking Motrin and Tylenol for her symptoms without improvement.  She reports associated nasal congestion.  No rhinorrhea, sore throat, neck pain or stiffness, numbness, weakness, diplopia, photophobia, eye pain, visual changes, nausea, vomiting, diarrhea, fever, chills, chest pain, shortness of breath.   The history is provided by the patient and medical records. No language interpreter was used.      Past Medical History:  Diagnosis Date   Back pain    Insomnia    Neuropathy    Palpitations    Panic disorder    PTSD (post-traumatic stress disorder)    Renal disorder    kidney stones   Seizures ([facility])    'pseudo seizures"    There are no problems to display for this patient.   Past Surgical History:  Procedure Laterality Date   lithrotripsy     TUBAL LIGATION       OB History   No obstetric history on file.     History reviewed. No pertinent family history.  Social History   Tobacco Use   Smoking status: Every Day    Packs/day: 0.50    Types: Cigarettes   Smokeless tobacco: Never  Vaping Use   Vaping Use: Never used  Substance Use Topics   Alcohol use:  Never   Drug use: Never    Home Medications Prior to Admission medications   Medication Sig Start Date End Date Taking? Authorizing Provider  amoxicillin-clavulanate (AUGMENTIN) 875-125 MG tablet Take 1 tablet by mouth every 12 (twelve) hours for 10 days. 06/22/21 07/02/21 Yes Lenka Zhao A, PA-C  cetirizine-pseudoephedrine (ZYRTEC-D) 5-120 MG tablet Take 1 tablet by mouth daily. 06/22/21  Yes Norvell Ureste A, PA-C  fluticasone (FLONASE) 50 MCG/ACT nasal spray Place 1 spray into both nostrils daily. 06/22/21  Yes Ayaansh Smail A, PA-C  atorvastatin (LIPITOR) 10 MG tablet Take 10 mg by mouth daily.    [provider]  clonazePAM (KLONOPIN) 1 MG tablet Take 1 mg by mouth 2 (two) times daily.    [provider]  gabapentin (NEURONTIN) 600 MG tablet Take 600 mg by mouth 3 (three) times daily.    [provider]  lidocaine (LMX) 4 % cream Apply 1 application topically 3 (three) times daily as needed. 09/25/19   Rodell Perna A, PA-C  Melatonin 10 MG TABS Take 10 mg by mouth at bedtime as needed (sleep aid).    [provider]  QUEtiapine (SEROQUEL) 400 MG tablet Take 1 tablet (400 mg total) by mouth at bedtime. 06/15/21   Prosperi, Christian H, PA-C    Allergies    Wellbutrin [bupropion]  Review of Systems   Review of Systems  Constitutional:  Negative for activity change, chills, diaphoresis and fever.  HENT:  Positive for congestion, ear pain, sinus pressure and sinus pain. Negative for ear discharge, facial swelling, nosebleeds, rhinorrhea, sore throat and voice change.   Eyes:  Negative for visual disturbance.  Respiratory:  Negative for cough, shortness of breath and wheezing.   Cardiovascular:  Negative for chest pain and palpitations.  Gastrointestinal:  Negative for abdominal pain, constipation, diarrhea, nausea and vomiting.  Genitourinary:  Negative for dysuria, frequency and urgency.  Musculoskeletal:  Negative for back pain, myalgias, neck pain and neck  stiffness.  Skin:  Negative for rash and wound.  Allergic/Immunologic: Negative for immunocompromised state.  Neurological:  Positive for headaches. Negative for dizziness, seizures, syncope, weakness, light-headedness and numbness.  Psychiatric/Behavioral:  Negative for confusion.    Physical Exam Updated Vital Signs BP 127/82   Pulse 70   Temp 99.3 F (37.4 C) (Oral)   Resp 16   LMP 06/19/2021   SpO2 97%   Physical Exam Vitals and nursing note reviewed.  Constitutional:      Appearance: She is not ill-appearing, toxic-appearing or diaphoretic.  HENT:     Head: Normocephalic.     Right Ear: A middle ear effusion is present.     Left Ear: A middle ear effusion is present.     Nose: Congestion present. No rhinorrhea.     Right Sinus: No maxillary sinus tenderness or frontal sinus tenderness.     Left Sinus: Maxillary sinus tenderness present. No frontal sinus tenderness.     Mouth/Throat:     Mouth: Mucous membranes are moist.  Eyes:     Conjunctiva/sclera: Conjunctivae normal.  Neck:     Comments: No meningismus. Cardiovascular:     Rate and Rhythm: Normal rate and regular rhythm.     Pulses: Normal pulses.     Heart sounds: Normal heart sounds. No murmur heard.   No friction rub. No gallop.  Pulmonary:     Effort: Pulmonary effort is normal. No respiratory distress.     Breath sounds: No stridor. No wheezing, rhonchi or rales.  Chest:     Chest wall: No tenderness.  Abdominal:     General: There is no distension.     Palpations: Abdomen is soft. There is no mass.     Tenderness: There is no abdominal tenderness. There is no right CVA tenderness, left CVA tenderness, guarding or rebound.     Hernia: No hernia is present.  Musculoskeletal:     Cervical back: Neck supple.  Skin:    General: Skin is warm.     Findings: No rash.  Neurological:     Mental Status: She is alert.     Comments: Mental Status: Patient is awake, alert, oriented to person, place, month,  year, and situation. Patient is able to give a clear and coherent history. Cranial Nerves: II: Visual Fields are full. Pupils are equal, round, and reactive to light. III,IV, VI: EOMI without ptosis or diploplia.  V: Facial sensation is symmetric to temperature VII: Facial movement is symmetric.  VIII: hearing is intact to voice X: Uvula elevates symmetrically XI: Shoulder shrug is symmetric. XII: tongue is midline without atrophy or fasciculations.  Motor: Tone is normal. Bulk is normal. 5/5 strength was present in all four extremities.  Sensory: Sensation is symmetric to light touch and temperature in the arms and legs. Cerebellar: FNF intact bilaterally    Psychiatric:        Behavior: Behavior normal.  ED Results / Procedures / Treatments   Labs (all labs ordered are listed, but only abnormal results are displayed) Labs Reviewed  CBC WITH DIFFERENTIAL/PLATELET  COMPREHENSIVE METABOLIC PANEL  SEDIMENTATION RATE  C-REACTIVE PROTEIN    EKG None  Radiology No results found.  Procedures Procedures   Medications Ordered in ED Medications  prochlorperazine (COMPAZINE) injection 10 mg (10 mg Intravenous Given 06/22/21 2347)  dexamethasone (DECADRON) injection 10 mg (10 mg Intravenous Given 06/22/21 2347)  diphenhydrAMINE (BENADRYL) injection 25 mg (25 mg Intravenous Given 06/22/21 2347)  sodium chloride 0.9 % bolus 500 mL (0 mLs Intravenous Stopped 06/23/21 0018)    ED Course  I have reviewed the triage vital signs and the nursing notes.  Pertinent labs & imaging results that were available during my care of the patient were reviewed by me and considered in my medical decision making (see chart for details).    MDM Rules/Calculators/A&P                           40 year old female with a history of pseudoseizures, kidney stones, PTSD, anxiety, panic attacks, palpitations who presents emergency department with a 7-day history of headache.  Headache has been  intermittent, but gradually worsening.  She has associated sinus pain and pressure nasal congestion.   Vital signs are stable.  She has no neurologic deficits.  Labs and imaging ordered from triage been reviewed by me.  Normal ESR and CRP.  CBC metabolic panel are normal.  Strongly suspect sinusitis as she has tenderness over the left maxillary sinus and there is a bilateral middle ear effusion.  She was given a migraine cocktail and headache significantly improved.  We will discharge the patient home with supportive care.  Did give the patient a course of Augmentin that she can start taking if by day 10 her headache is not completely resolved.  I have a low suspicion for meningitis, COVID-19, CVA, intracranial hemorrhage, SAH, or temporal arteritis.   Final Clinical Impression(s) / ED Diagnoses Final diagnoses:  Acute pansinusitis, recurrence not specified    Rx / DC Orders ED Discharge Orders          Ordered    amoxicillin-clavulanate (AUGMENTIN) 875-125 MG tablet  Every 12 hours        06/22/21 2337    cetirizine-pseudoephedrine (ZYRTEC-D) 5-120 MG tablet  Daily        06/22/21 2337    fluticasone (FLONASE) 50 MCG/ACT nasal spray  Daily        06/22/21 2337             Joline Maxcy A, PA-C 06/23/21 0027    Orpah Greek, MD 06/23/21 [phone]

## 2021-06-22 NOTE — ED Notes (Signed)
Pt took 1000 mg of her own Tylenol.

## 2021-06-25 ENCOUNTER — Emergency Department (HOSPITAL_COMMUNITY)
Admission: EM | Admit: 2021-06-25 | Discharge: 2021-06-25 | Disposition: A | Discharge status: Home / Self Care | Payer: Medicare Other | Attending: Emergency Medicine | Admitting: Emergency Medicine

## 2021-06-25 ENCOUNTER — Encounter (HOSPITAL_COMMUNITY): Payer: Self-pay | Admitting: Emergency Medicine

## 2021-06-25 ENCOUNTER — Emergency Department (HOSPITAL_COMMUNITY): Payer: Medicare Other

## 2021-06-25 DIAGNOSIS — M542 Cervicalgia: Secondary | ICD-10-CM | POA: Diagnosis present

## 2021-06-25 DIAGNOSIS — F17210 Nicotine dependence, cigarettes, uncomplicated: Secondary | ICD-10-CM | POA: Diagnosis not present

## 2021-06-25 DIAGNOSIS — E079 Disorder of thyroid, unspecified: Secondary | ICD-10-CM | POA: Insufficient documentation

## 2021-06-25 MED ORDER — HYDROCODONE-ACETAMINOPHEN 5-325 MG PO TABS
1.0000 | ORAL_TABLET | Freq: Once | ORAL | Status: AC
  Administered 2021-06-25: 1 via ORAL
  Filled 2021-06-25: qty 1

## 2021-06-25 MED ORDER — METHOCARBAMOL 500 MG PO TABS: 500.0000 mg | ORAL_TABLET | Freq: Two times a day (BID) | ORAL | 0 refills | Status: DC

## 2021-06-25 MED ORDER — METHOCARBAMOL 500 MG PO TABS
500.0000 mg | ORAL_TABLET | Freq: Once | ORAL | Status: AC
  Administered 2021-06-25: 500 mg via ORAL
  Filled 2021-06-25: qty 1

## 2021-06-25 MED ORDER — NAPROXEN 500 MG PO TABS: 500.0000 mg | ORAL_TABLET | Freq: Two times a day (BID) | ORAL | 0 refills | Status: DC

## 2021-06-25 MED ORDER — LIDOCAINE 5 % EX PTCH
1.0000 | MEDICATED_PATCH | CUTANEOUS | Status: DC
  Administered 2021-06-25: 1 via TRANSDERMAL
  Filled 2021-06-25: qty 1

## 2021-06-25 NOTE — ED Provider Notes (Signed)
[facility]-EMERGENCY DEPT Provider Note   CSN: [id number] Arrival date & time: 06/25/21  1429     History Chief Complaint  Patient presents with   Neck Pain    Anne Meyers is a 40 y.o. female with a past medical history significant for PTSD, anxiety, chronic pain syndrome, and pseudoseizures who presents to the ED due to neck pain.  Patient states her neck has been hurting for the past few days.  Pain radiates down bilateral upper extremities.  Denies associated weakness and numbness/tingling.  No fever or chills.  No history of IV drug use.  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 No direct trauma. She has tried OTC medications with no relief.   History obtained from patient and past medical records. No interpreter used during encounter.       Past Medical History:  Diagnosis Date   Back pain    Insomnia    Neuropathy    Palpitations    Panic disorder    PTSD (post-traumatic stress disorder)    Renal disorder    kidney stones   Seizures ([facility])    'pseudo seizures"    There are no problems to display for this patient.   Past Surgical History:  Procedure Laterality Date   lithrotripsy     TUBAL LIGATION       OB History   No obstetric history on file.     No family history on file.  Social History   Tobacco Use   Smoking status: Every Day    Packs/day: 0.50    Types: Cigarettes   Smokeless tobacco: Never  Vaping Use   Vaping Use: Never used  Substance Use Topics   Alcohol use: Never   Drug use: Never    Home Medications Prior to Admission medications   Medication Sig Start Date End Date Taking? Authorizing Provider  methocarbamol (ROBAXIN) 500 MG tablet Take 1 tablet (500 mg total) by mouth 2 (two) times daily. 06/25/21  Yes Alejandra Barna C, PA-C  naproxen (NAPROSYN) 500 MG tablet Take 1 tablet (500 mg total) by mouth 2 (two) times daily. 06/25/21  Yes Mieke Brinley C, PA-C   amoxicillin-clavulanate (AUGMENTIN) 875-125 MG tablet Take 1 tablet by mouth every 12 (twelve) hours for 10 days. 06/22/21 07/02/21  McDonald, Mia A, PA-C  atorvastatin (LIPITOR) 10 MG tablet Take 10 mg by mouth daily.    [provider]  cetirizine-pseudoephedrine (ZYRTEC-D) 5-120 MG tablet Take 1 tablet by mouth daily. 06/22/21   McDonald, Mia A, PA-C  clonazePAM (KLONOPIN) 1 MG tablet Take 1 mg by mouth 2 (two) times daily.    [provider]  fluticasone (FLONASE) 50 MCG/ACT nasal spray Place 1 spray into both nostrils daily. 06/22/21   McDonald, Mia A, PA-C  gabapentin (NEURONTIN) 600 MG tablet Take 600 mg by mouth 3 (three) times daily.    [provider]  lidocaine (LMX) 4 % cream Apply 1 application topically 3 (three) times daily as needed. 09/25/19   Michela Pitcher A, PA-C  Melatonin 10 MG TABS Take 10 mg by mouth at bedtime as needed (sleep aid).    [provider]  QUEtiapine (SEROQUEL) 400 MG tablet Take 1 tablet (400 mg total) by mouth at bedtime. 06/15/21   Prosperi, Christian H, PA-C    Allergies    Wellbutrin [bupropion]  Review of Systems   Review of Systems  Constitutional:  Negative for chills and  fever.  Musculoskeletal:  Positive for neck pain. Negative for neck stiffness.  Neurological:  Negative for weakness and numbness.   Physical Exam Updated Vital Signs BP (!) 135/99 (BP Location: Right Arm)   Pulse 86   Temp 98.5 F (36.9 C) (Oral)   Resp 18   LMP 06/19/2021   SpO2 99%   Physical Exam Vitals and nursing note reviewed.  Constitutional:      General: She is not in acute distress.    Appearance: She is not ill-appearing.  HENT:     Head: Normocephalic.  Eyes:     Pupils: Pupils are equal, round, and reactive to light.  Neck:     Comments: Cervical midline tenderness. Equal grip strength.  Cardiovascular:     Rate and Rhythm: Normal rate and regular rhythm.     Pulses: Normal pulses.     Heart sounds: Normal heart sounds.  No murmur heard.   No friction rub. No gallop.  Pulmonary:     Effort: Pulmonary effort is normal.     Breath sounds: Normal breath sounds.  Abdominal:     General: Abdomen is flat. There is no distension.     Palpations: Abdomen is soft.     Tenderness: There is no abdominal tenderness. There is no guarding or rebound.  Musculoskeletal:        General: Normal range of motion.     Cervical back: Neck supple.     Comments: No thoracic or lumbar midline tenderness  Skin:    General: Skin is warm and dry.  Neurological:     General: No focal deficit present.     Mental Status: She is alert.  Psychiatric:        Mood and Affect: Mood normal.        Behavior: Behavior normal.    ED Results / Procedures / Treatments   Labs (all labs ordered are listed, but only abnormal results are displayed) Labs Reviewed - No data to display  EKG None  Radiology CT Cervical Spine Wo Contrast  Result Date: 06/25/2021 CLINICAL DATA:  Pain radiating down both arms EXAM: CT CERVICAL SPINE WITHOUT CONTRAST TECHNIQUE: Multidetector CT imaging of the cervical spine was performed without intravenous contrast. Multiplanar CT image reconstructions were also generated. COMPARISON:  None. FINDINGS: Alignment: Normal Skull base and vertebrae: No acute fracture. No primary bone lesion or focal pathologic process. Soft tissues and spinal canal: No prevertebral fluid or swelling. No visible canal hematoma. Disc levels: Disc spaces are maintained. No neural foraminal narrowing. Upper chest: No acute findings Other: Large mass in the right thyroid lobe measuring up to 4.5 cm. IMPRESSION: No abnormality seen in the cervical spine. Large mass in the left thyroid lobe, 4.5 cm. Recommend thyroid US (ref: J Am Coll Radiol. 2015 Feb;12(2): 143-50). Electronically Signed   By: Charlett Nose M.D.   On: 06/25/2021 19:46    Procedures Procedures   Medications Ordered in ED Medications  lidocaine (LIDODERM) 5 % 1 patch (1  patch Transdermal Patch Applied 06/25/21 1919)  HYDROcodone-acetaminophen (NORCO/VICODIN) 5-325 MG per tablet 1 tablet (1 tablet Oral Given 06/25/21 1918)  methocarbamol (ROBAXIN) tablet 500 mg (500 mg Oral Given 06/25/21 1918)    ED Course  I have reviewed the triage vital signs and the nursing notes.  Pertinent labs & imaging results that were available during my care of the patient were reviewed by me and considered in my medical decision making (see chart for details).    MDM  Rules/Calculators/A&P                           40 year old female presents to the ED due to neck pain for the past few days.  No fever or chills.  No IV drug use.  Upon arrival, stable vitals.  Patient is afebrile, not tachycardic or hypoxic.  Patient in no acute distress.  Midline cervical tenderness.  Equal grip strength.  Low suspicion for central cord compression.  Given midline tenderness will obtain CT cervical spine. Norco, Robaxin, and Lidoderm patch for symptomatic relief.   CT cervical spine personally reviewed which demonstrates a thyroid mass.  Patient is aware of thyroid mass and noted she was told a while ago however, has not followed up.  No abnormalities in the cervical spine.  Patient given [facility] number to follow-up in regards to thyroid mass.  Patient also given neurosurgery number at discharge to follow-up in regards to neck pain.  Equal grip strength.  No weakness.  Low suspicion for central cord compression.  No infectious symptoms to suggest infectious etiology.  Patient discharged with pain medication and Robaxin. Strict ED precautions discussed with patient. Patient states understanding and agrees to plan. Patient discharged home in no acute distress and stable vitals  Final Clinical Impression(s) / ED Diagnoses Final diagnoses:  Neck pain  Thyroid mass    Rx / DC Orders ED Discharge Orders          Ordered    naproxen (NAPROSYN) 500 MG tablet  2 times daily        06/25/21 2051     methocarbamol (ROBAXIN) 500 MG tablet  2 times daily        06/25/21 2051             Jesusita Oka 06/25/21 2055    Jacalyn Lefevre, MD 06/25/21 2152

## 2021-06-25 NOTE — Discharge Instructions (Signed)
It was pleasure taking care of you today.  As discussed, your CT scan did not show any abnormalities in your neck.  It did show a thyroid mass.  I have included the number for [facility].  Please call to schedule an appointment for evaluation of this thyroid mass.  I am sending you with pain medication and muscle relaxer.  Take as needed for pain. You may also purchase over-the-counter Voltaren gel and Lidoderm patches for added pain relief.  I have included the number for the neurosurgeon.  Please call to schedule an appointment for further evaluation of your neck pain.  Return to the ER for new or worsening symptoms.

## 2021-06-25 NOTE — ED Triage Notes (Signed)
Per pt, states she was here this past Friday for left headache pain-states she was diagnosed with a sinus infection-states it is due to her neck-states pain is radiating down both arms

## 2021-07-16 ENCOUNTER — Emergency Department (HOSPITAL_COMMUNITY)
Admission: EM | Admit: 2021-07-16 | Discharge: 2021-07-16 | Disposition: A | Discharge status: Home / Self Care | Payer: Medicare Other | Attending: Emergency Medicine | Admitting: Emergency Medicine

## 2021-07-16 ENCOUNTER — Emergency Department (HOSPITAL_COMMUNITY): Payer: Medicare Other

## 2021-07-16 DIAGNOSIS — F419 Anxiety disorder, unspecified: Secondary | ICD-10-CM | POA: Insufficient documentation

## 2021-07-16 DIAGNOSIS — R0602 Shortness of breath: Secondary | ICD-10-CM | POA: Diagnosis not present

## 2021-07-16 DIAGNOSIS — F17210 Nicotine dependence, cigarettes, uncomplicated: Secondary | ICD-10-CM | POA: Insufficient documentation

## 2021-07-16 DIAGNOSIS — R42 Dizziness and giddiness: Secondary | ICD-10-CM | POA: Diagnosis not present

## 2021-07-16 DIAGNOSIS — R002 Palpitations: Secondary | ICD-10-CM | POA: Insufficient documentation

## 2021-07-16 LAB — BASIC METABOLIC PANEL
Anion gap: 7 (ref 5–15)
BUN: 11 mg/dL (ref 6–20)
CO2: 22 mmol/L (ref 22–32)
Calcium: 9.3 mg/dL (ref 8.9–10.3)
Chloride: 107 mmol/L (ref 98–111)
Creatinine, Ser: 0.68 mg/dL (ref 0.44–1.00)
GFR, Estimated: >60 mL/min (ref >60)
Glucose, Bld: 101 mg/dL — ABNORMAL HIGH (ref 70–99)
Potassium: 4.7 mmol/L (ref 3.5–5.1)
Sodium: 136 mmol/L (ref 135–145)

## 2021-07-16 LAB — CBC
HCT: 42.9 % (ref 36.0–46.0)
Hemoglobin: 14 g/dL (ref 12.0–15.0)
MCH: 30.9 pg (ref 26.0–34.0)
MCHC: 32.6 g/dL (ref 30.0–36.0)
MCV: 94.7 fL (ref 80.0–100.0)
Platelets: 318 10*3/uL (ref 150–400)
RBC: 4.53 MIL/uL (ref 3.87–5.11)
RDW: 12.2 % (ref 11.5–15.5)
WBC: 6.1 10*3/uL (ref 4.0–10.5)
nRBC: 0 % (ref 0.0–0.2)

## 2021-07-16 LAB — MAGNESIUM: Magnesium: 2.1 mg/dL (ref 1.7–2.4)

## 2021-07-16 LAB — TROPONIN I (HIGH SENSITIVITY)
Troponin I (High Sensitivity): <2 ng/L (ref <18)
Troponin I (High Sensitivity): 3 ng/L (ref <18)

## 2021-07-16 LAB — I-STAT BETA HCG BLOOD, ED (MC, WL, AP ONLY): I-stat hCG, quantitative: <5 m[IU]/mL (ref <5)

## 2021-07-16 MED ORDER — HYDROXYZINE HCL 25 MG PO TABS
25.0000 mg | ORAL_TABLET | Freq: Once | ORAL | Status: AC
  Administered 2021-07-16: 25 mg via ORAL
  Filled 2021-07-16: qty 1

## 2021-07-16 MED ORDER — HYDROXYZINE HCL 25 MG PO TABS: 25.0000 mg | ORAL_TABLET | Freq: Four times a day (QID) | ORAL | 0 refills | Status: DC

## 2021-07-16 NOTE — ED Provider Notes (Signed)
History [facility] EMERGENCY DEPARTMENT Provider Note   CSN: [id number] Arrival date & time: 07/16/21  1549     History Chief Complaint  Patient presents with   Shortness of Breath    Anne Meyers is a 40 y.o. female with a past medical history significant for PTSD, pseudoseizures kidney stones, and anxiety who presents to the ED due to intermittent palpitations that have been present for 1.5 months.  Patient states frequency has increased.  Denies associated chest pain.  No history of blood clots or recent surgeries or recent long immobilizations, or hormonal treatments.  No lower extremity edema.  Patient states she typically feels anxious during palpitations which causing them to worsen. She is currently taking Seroquel, but stopped Klonopin over a year ago. She has a PCP appointment on 10/4 to establish care. Per patient, she has been evaluated previous for palpitations and told she was having frequent PVCs. She notes her anxiety increases during these episodes.  Patient admits to some associated shortness of breath and lightheadedness during palpitations.  No nausea or vomiting.  Denies abdominal pain.  No treatment prior to arrival.  No aggravating or alleviating factors.  History obtained from patient and past medical records. No interpreter used during encounter.       Past Medical History:  Diagnosis Date   Back pain    Insomnia    Neuropathy    Palpitations    Panic disorder    PTSD (post-traumatic stress disorder)    Renal disorder    kidney stones   Seizures (HCC)    'pseudo seizures"    There are no problems to display for this patient.   Past Surgical History:  Procedure Laterality Date   lithrotripsy     TUBAL LIGATION       OB History   No obstetric history on file.     No family history on file.  Social History   Tobacco Use   Smoking status: Every Day    Packs/day: 0.50    Types: Cigarettes   Smokeless tobacco: Never   Vaping Use   Vaping Use: Never used  Substance Use Topics   Alcohol use: Never   Drug use: Never    Home Medications Prior to Admission medications   Medication Sig Start Date End Date Taking? Authorizing Provider  hydrOXYzine (ATARAX/VISTARIL) 25 MG tablet Take 1 tablet (25 mg total) by mouth every 6 (six) hours. 07/16/21  Yes Chidi Shirer C, PA-C  atorvastatin (LIPITOR) 10 MG tablet Take 10 mg by mouth daily.    [provider]  cetirizine-pseudoephedrine (ZYRTEC-D) 5-120 MG tablet Take 1 tablet by mouth daily. 06/22/21   McDonald, Mia A, PA-C  clonazePAM (KLONOPIN) 1 MG tablet Take 1 mg by mouth 2 (two) times daily.    [provider]  fluticasone (FLONASE) 50 MCG/ACT nasal spray Place 1 spray into both nostrils daily. 06/22/21   McDonald, Mia A, PA-C  gabapentin (NEURONTIN) 600 MG tablet Take 600 mg by mouth 3 (three) times daily.    [provider]  lidocaine (LMX) 4 % cream Apply 1 application topically 3 (three) times daily as needed. 09/25/19   Michela Pitcher A, PA-C  Melatonin 10 MG TABS Take 10 mg by mouth at bedtime as needed (sleep aid).    [provider]  methocarbamol (ROBAXIN) 500 MG tablet Take 1 tablet (500 mg total) by mouth 2 (two) times daily. 06/25/21   Mannie Stabile, PA-C  naproxen (NAPROSYN) 500 MG  tablet Take 1 tablet (500 mg total) by mouth 2 (two) times daily. 06/25/21   Mannie Stabile, PA-C  QUEtiapine (SEROQUEL) 400 MG tablet Take 1 tablet (400 mg total) by mouth at bedtime. 06/15/21   Prosperi, Christian H, PA-C    Allergies    Wellbutrin [bupropion]  Review of Systems   Review of Systems  Constitutional:  Negative for chills and fever.  Respiratory:  Positive for shortness of breath.   Cardiovascular:  Positive for palpitations. Negative for chest pain and leg swelling.  Gastrointestinal:  Negative for abdominal pain.  All other systems reviewed and are negative.  Physical Exam Updated Vital Signs BP 115/66 (BP  Location: Left Arm)   Pulse 85   Temp 98.8 F (37.1 C) (Oral)   Resp 14   LMP 06/19/2021   SpO2 100%   Physical Exam Vitals and nursing note reviewed.  Constitutional:      General: She is not in acute distress.    Appearance: She is not ill-appearing.  HENT:     Head: Normocephalic.  Eyes:     Pupils: Pupils are equal, round, and reactive to light.  Cardiovascular:     Rate and Rhythm: Normal rate and regular rhythm.     Pulses: Normal pulses.     Heart sounds: Normal heart sounds. No murmur heard.   No friction rub. No gallop.  Pulmonary:     Effort: Pulmonary effort is normal.     Breath sounds: Normal breath sounds.     Comments: Respirations equal and unlabored, patient able to speak in full sentences, lungs clear to auscultation bilaterally Abdominal:     General: Abdomen is flat. There is no distension.     Palpations: Abdomen is soft.     Tenderness: There is no abdominal tenderness. There is no guarding or rebound.  Musculoskeletal:        General: Normal range of motion.     Cervical back: Neck supple.     Comments: No lower extremity edema. Negative homan sign bilaterally.  Skin:    General: Skin is warm and dry.  Neurological:     General: No focal deficit present.     Mental Status: She is alert.  Psychiatric:        Mood and Affect: Mood normal.        Behavior: Behavior normal.    ED Results / Procedures / Treatments   Labs (all labs ordered are listed, but only abnormal results are displayed) Labs Reviewed  BASIC METABOLIC PANEL - Abnormal; Notable for the following components:      Result Value   Glucose, Bld 101 (*)    All other components within normal limits  CBC  MAGNESIUM  TSH  I-STAT BETA HCG BLOOD, ED ([facility], [facility], [facility] ONLY)  TROPONIN I (HIGH SENSITIVITY)  TROPONIN I (HIGH SENSITIVITY)    EKG EKG Interpretation  Date/Time:  Tuesday July 16 2021 15:55:20 EDT Ventricular Rate:  82 PR Interval:  160 QRS Duration: 92 QT  Interval:  378 QTC Calculation: 441 R Axis:   70 Text Interpretation: Sinus rhythm with occasional Premature ventricular complexes Mild, diffuse T wave inversions Normal axis Abnormal ECG Confirmed by Pieter Partridge ([id number]) on 07/16/2021 10:35:01 PM  Radiology DG Chest 2 View  Result Date: 07/16/2021 CLINICAL DATA:  SHOB EXAM: CHEST - 2 VIEW COMPARISON:  None. FINDINGS: The cardiomediastinal silhouette is within normal limits. No pleural effusion. No pneumothorax. No mass or consolidation. No acute osseous abnormality. IMPRESSION: No  acute abnormality in the chest. Electronically Signed   By: Olive Bass M.D.   On: 07/16/2021 17:28    Procedures Procedures   Medications Ordered in ED Medications  hydrOXYzine (ATARAX/VISTARIL) tablet 25 mg (25 mg Oral Given 07/16/21 2258)    ED Course  I have reviewed the triage vital signs and the nursing notes.  Pertinent labs & imaging results that were available during my care of the patient were reviewed by me and considered in my medical decision making (see chart for details).    MDM Rules/Calculators/A&P                          40 year old female presents to the ED due to intermittent palpitations for the past 1.5 months.  History of same.  Patient has a history of anxiety and notes worsening anxiety during episodes.  No associated chest pain.  Patient denies history of blood clots, recent surgeries, recent long immobilizations, and hormonal treatment.  Upon arrival, stable vitals.  Patient is afebrile, not tachycardic or hypoxic.  Unfortunately, patient waited over 7 hours prior to initial evaluation due to long wait times.  Patient in no acute distress.  Benign physical exam.  No lower extremity edema.  Negative Homan sign bilaterally.  PERC negative and low risk using Wells criteria.  Doubt PE/DVT.  Labs, chest x-ray, EKG ordered at triage.  Patient given Vistaril. Patient clearly anxious at bedside and tearful. Suspect underlying anxiety and PVCs  could be the etiology of palpitations.  CBC unremarkable with no leukocytosis and normal hemoglobin.  Normal magnesium.  BMP with normal renal function.  Glucose at 101.  No major electrolyte derangements.  Pregnancy test negative.  Delta troponin flat.  EKG personally reviewed which demonstrates normal sinus rhythm with diffuse inverted T waves.  Chart reviewed.  Patient had EKG performed in 2020 in [place] which also demonstrated diffuse inverted T waves.  Low suspicion for ACS.  Chest x-ray personally reviewed which is negative for signs of pneumonia, pneumothorax, or widened mediastinum.  Feel patient will benefit from cardiology referral due to ongoing palpitations.  Advised patient to follow-up with her PCP at her scheduled appointment for further evaluation of anxiety.  Patient discharged with prescription for Vistaril. Strict ED precautions discussed with patient. Patient states understanding and agrees to plan. Patient discharged home in no acute distress and stable vitals.  Discussed case with Dr. Delford Field who agrees with assessment and plan.  Final Clinical Impression(s) / ED Diagnoses Final diagnoses:  Palpitations    Rx / DC Orders ED Discharge Orders          Ordered    hydrOXYzine (ATARAX/VISTARIL) 25 MG tablet  Every 6 hours        07/16/21 2308             Jesusita Oka 07/16/21 2313    Koleen Distance, MD 07/19/21 1306

## 2021-07-16 NOTE — Discharge Instructions (Addendum)
It was a pleasure taking care of you today.  As discussed, all of your labs are reassuring.  I suspect your palpitations could be related to your PVCs and anxiety worsening them.  I am sending you home with anxiety medication.  Take as needed.  Please follow-up with PCP at your scheduled appointment for further evaluation of anxiety.  I have included number of the cardiologist.  Please call tomorrow to schedule an appointment for further evaluation of your palpitations.  Return to the ER for any worsening symptoms.  Your TSH is pending. Please follow-up on MyChart for your results.

## 2021-07-16 NOTE — ED Notes (Signed)
Pt awarded Electronics engineer- RN called to arrange taxi, dispatch unable to advise on pickup time, pt on list for pickup

## 2021-07-16 NOTE — ED Triage Notes (Signed)
Pt c/o episode of palpitations, SHOB, lightheadedness, VSS en route, occasional PVC. Pt advised similar episode "awhile ago," stated she was told she was having frequent PVC. Has follow up appt w cards 10/4 for "valve issue," no other cards hx.   114/76 90 HR

## 2021-07-16 NOTE — ED Provider Notes (Signed)
Emergency Medicine Provider Triage Evaluation Note  Anne Meyers , a 40 y.o. female  was evaluated in triage.  Pt complains of palpitations. Palpitations have been occurring for about a month and a half.  She was evaluated more than about 3 weeks ago.  She said at that time she came via EMS because she was having associated shortness of breath and near syncope.  Per patient, EMS saw dysrhythmia on their monitor.  Patient was evaluated in our ED with no findings.  She was set up with a primary care doctor.  She has a follow-up appointment on October 4.  The last episode of palpitations occurred last night.  She again has associated shortness of breath and feels like she is going to pass out as well as lightheadedness.  This occurs daily or multiple times a day.  It is usually with exertion.  She has associated anxiety but she thinks that the anxiety starts after the palpitations. Does have a history of panic disorder.  She used to be on Klonopin for this but she came off of it.  She still suffers from anxiety.  She is also on Seroquel nightly to help her sleep.  Sometimes she has more palpitations after taking this medication.  Review of Systems  Positive: Palpitations, shortness of breath, near-syncope, lightheadedness, body Negative: Pain, abdominal pain, nausea, vomiting  Physical Exam  BP (!) 112/54 (BP Location: Left Arm)   Pulse 79   Temp 98.5 F (36.9 C) (Oral)   Resp 20   LMP 06/19/2021   SpO2 100%  Gen:   Awake, no distress.  Patient is tearful and anxious during history. Resp:  Normal effort MSK:   Moves extremities without difficulty Other:  Heart sounds with regular rate and rhythm with no murmurs.  Medical Decision Making  Medically screening exam initiated at 4:15 PM.  Appropriate orders placed.  [name] was informed that the remainder of the evaluation will be completed by another provider, this initial triage assessment does not replace that evaluation, and the  importance of remaining in the ED until their evaluation is complete.   Claudie Leach, PA-C 07/16/21 1620    Virgina Norfolk, DO 07/16/21 2153

## 2021-07-23 ENCOUNTER — Encounter (INDEPENDENT_AMBULATORY_CARE_PROVIDER_SITE_OTHER): Payer: Self-pay

## 2021-07-23 ENCOUNTER — Encounter: Payer: Self-pay | Admitting: Family Medicine

## 2021-07-23 ENCOUNTER — Ambulatory Visit (INDEPENDENT_AMBULATORY_CARE_PROVIDER_SITE_OTHER): Payer: Medicare Other | Admitting: Family Medicine

## 2021-07-23 ENCOUNTER — Other Ambulatory Visit: Payer: Self-pay

## 2021-07-23 VITALS — BP 106/72 | HR 80 | Temp 97.8°F | Resp 16 | Ht 65.0 in | Wt 179.6 lb

## 2021-07-23 DIAGNOSIS — E079 Disorder of thyroid, unspecified: Secondary | ICD-10-CM | POA: Diagnosis not present

## 2021-07-23 DIAGNOSIS — Z7689 Persons encountering health services in other specified circumstances: Secondary | ICD-10-CM

## 2021-07-23 DIAGNOSIS — F419 Anxiety disorder, unspecified: Secondary | ICD-10-CM

## 2021-07-23 DIAGNOSIS — G4709 Other insomnia: Secondary | ICD-10-CM

## 2021-07-23 DIAGNOSIS — Z131 Encounter for screening for diabetes mellitus: Secondary | ICD-10-CM

## 2021-07-23 DIAGNOSIS — E7849 Other hyperlipidemia: Secondary | ICD-10-CM

## 2021-07-23 MED ORDER — QUETIAPINE FUMARATE 400 MG PO TABS: 400.0000 mg | ORAL_TABLET | Freq: Every day | ORAL | 2 refills | Status: DC

## 2021-07-23 MED ORDER — CLONAZEPAM 1 MG PO TABS: 1.0000 mg | ORAL_TABLET | Freq: Every day | ORAL | 0 refills | Status: DC | PRN

## 2021-07-23 NOTE — Progress Notes (Signed)
New Patient Office Visit  Subjective:  Patient ID: Anne Meyers, female    DOB: 08-08-1981  Age: 40 y.o. MRN: [id number]  CC:  Chief Complaint  Patient presents with   Establish Care   Medication Refill   Annual Exam    HPI Anne Meyers presents for to establish care.  Patient's primary concern today is for follow-up of large swelling considered to be a thyroid mass in her neck.  She reports that she is having increased symptoms including fatigue and will hair loss and weight loss over the past several weeks.  She is also requesting review of her chronic medical issues with med refills.  Past Medical History:  Diagnosis Date   Back pain    Insomnia    Neuropathy    Palpitations    Panic disorder    PTSD (post-traumatic stress disorder)    Seizures ([place])    'pseudo seizures"    Past Surgical History:  Procedure Laterality Date   lithrotripsy     TUBAL LIGATION      History reviewed. No pertinent family history.  Social History   Socioeconomic History   Marital status: Single    Spouse name: Not on file   Number of children: Not on file   Years of education: Not on file   Highest education level: Not on file  Occupational History   Not on file  Tobacco Use   Smoking status: Every Day    Packs/day: 0.50    Types: Cigarettes   Smokeless tobacco: Never  Vaping Use   Vaping Use: Never used  Substance and Sexual Activity   Alcohol use: Never   Drug use: Never   Sexual activity: Not on file  Other Topics Concern   Not on file  Social History Narrative   Not on file   Social Determinants of Health   Financial Resource Strain: Not on file  Food Insecurity: Not on file  Transportation Needs: Not on file  Physical Activity: Not on file  Stress: Not on file  Social Connections: Not on file  Intimate Partner Violence: Not on file    ROS Review of Systems  Constitutional:  Positive for appetite change, fatigue and unexpected weight change.   Cardiovascular:  Positive for palpitations.  All other systems reviewed and are negative.  Objective:   Today's Vitals: BP 106/72 (BP Location: Right Arm, Patient Position: Sitting, Cuff Size: Large)   Pulse 80   Temp 97.8 F (36.6 C) (Oral)   Resp 16   Ht _0  (1.651 m)   Wt 179 lb 9.6 oz (81.5 kg)   SpO2 98%   BMI 29.89 kg/m   Physical Exam Vitals and nursing note reviewed.  Constitutional:      General: She is not in acute distress. Neck:     Thyroid: Thyroid mass present.  Cardiovascular:     Rate and Rhythm: Normal rate and regular rhythm.  Pulmonary:     Effort: Pulmonary effort is normal.     Breath sounds: Normal breath sounds.  Abdominal:     Palpations: Abdomen is soft.     Tenderness: There is no abdominal tenderness.  Musculoskeletal:     Cervical back: Normal range of motion and neck supple. No tenderness.  Neurological:     General: No focal deficit present.     Mental Status: She is alert and oriented to person, place, and time.    Assessment & Plan:    1. Other hyperlipidemia Monitoring labs  were ordered.  Will defer restart of any statin medicine at this time. - Lipid Panel - CMP14+EGFR  2. Thyroid mass Monitoring labs were ordered.  Ultrasound of thyroid was ordered.  Referral to Endo for further evaluation and management. - US THYROID; Future - T4, Free - T3, Free - TSH - Ambulatory referral to Endocrinology - CMP14+EGFR  3. Other insomnia Seroquel was refilled.  Patient has utilized this in the past with good results.  4. Anxiety Patient has been utilizing this medicine sparingly in the past.  Does reports some minimal increase of symptoms over the past several weeks concerning her thyroid. - clonazePAM (KLONOPIN) 1 MG tablet; Take 1 tablet (1 mg total) by mouth daily as needed for anxiety.  Dispense: 30 tablet; Refill: 0  5. Screening for diabetes mellitus (DM)   6. Encounter to establish care    Outpatient Encounter  Medications as of 07/23/2021  Medication Sig   clonazePAM (KLONOPIN) 1 MG tablet Take 1 mg by mouth 2 (two) times daily.   QUEtiapine (SEROQUEL) 400 MG tablet Take 1 tablet (400 mg total) by mouth at bedtime.   atorvastatin (LIPITOR) 10 MG tablet Take 10 mg by mouth daily. (Patient not taking: Reported on 07/23/2021)   cetirizine-pseudoephedrine (ZYRTEC-D) 5-120 MG tablet Take 1 tablet by mouth daily. (Patient not taking: Reported on 07/23/2021)   fluticasone (FLONASE) 50 MCG/ACT nasal spray Place 1 spray into both nostrils daily. (Patient not taking: Reported on 07/23/2021)   gabapentin (NEURONTIN) 600 MG tablet Take 600 mg by mouth 3 (three) times daily. (Patient not taking: Reported on 07/23/2021)   hydrOXYzine (ATARAX/VISTARIL) 25 MG tablet Take 1 tablet (25 mg total) by mouth every 6 (six) hours. (Patient not taking: Reported on 07/23/2021)   lidocaine (LMX) 4 % cream Apply 1 application topically 3 (three) times daily as needed. (Patient not taking: Reported on 07/23/2021)   Melatonin 10 MG TABS Take 10 mg by mouth at bedtime as needed (sleep aid). (Patient not taking: Reported on 07/23/2021)   methocarbamol (ROBAXIN) 500 MG tablet Take 1 tablet (500 mg total) by mouth 2 (two) times daily. (Patient not taking: Reported on 07/23/2021)   naproxen (NAPROSYN) 500 MG tablet Take 1 tablet (500 mg total) by mouth 2 (two) times daily. (Patient not taking: Reported on 07/23/2021)   No facility-administered encounter medications on file as of 07/23/2021.    Follow-up: Return in about 3 months (around 10/23/2021) for follow up.   Becky Sax, MD

## 2021-07-23 NOTE — Progress Notes (Signed)
Patient is here to est care. [place] . Patient need medication refill. Patient would like to talk about her thyroid issue.

## 2021-07-24 LAB — LIPID PANEL
Chol/HDL Ratio: 4.5 ratio — ABNORMAL HIGH (ref 0.0–4.4)
Cholesterol, Total: 231 mg/dL — ABNORMAL HIGH (ref 100–199)
HDL: 51 mg/dL (ref >39)
LDL Chol Calc (NIH): 167 mg/dL — ABNORMAL HIGH (ref 0–99)
Triglycerides: 76 mg/dL (ref 0–149)
VLDL Cholesterol Cal: 13 mg/dL (ref 5–40)

## 2021-07-24 LAB — CMP14+EGFR
ALT: 9 IU/L (ref 0–32)
AST: 12 IU/L (ref 0–40)
Albumin/Globulin Ratio: 2.5 — ABNORMAL HIGH (ref 1.2–2.2)
Albumin: 4.8 g/dL (ref 3.8–4.8)
Alkaline Phosphatase: 84 IU/L (ref 44–121)
BUN/Creatinine Ratio: 14 (ref 9–23)
BUN: 9 mg/dL (ref 6–20)
Bilirubin Total: <0.2 mg/dL (ref 0.0–1.2)
CO2: 22 mmol/L (ref 20–29)
Calcium: 9.4 mg/dL (ref 8.7–10.2)
Chloride: 106 mmol/L (ref 96–106)
Creatinine, Ser: 0.63 mg/dL (ref 0.57–1.00)
Globulin, Total: 1.9 g/dL (ref 1.5–4.5)
Glucose: 87 mg/dL (ref 70–99)
Potassium: 4.1 mmol/L (ref 3.5–5.2)
Sodium: 142 mmol/L (ref 134–144)
Total Protein: 6.7 g/dL (ref 6.0–8.5)
eGFR: 116 mL/min/{1.73_m2} (ref >59)

## 2021-07-24 LAB — T4, FREE: Free T4: 1.07 ng/dL (ref 0.82–1.77)

## 2021-07-24 LAB — TSH: TSH: 0.737 u[IU]/mL (ref 0.450–4.500)

## 2021-07-24 LAB — T3, FREE: T3, Free: 2.7 pg/mL (ref 2.0–4.4)

## 2021-07-26 ENCOUNTER — Encounter: Payer: Self-pay | Admitting: Family Medicine

## 2021-07-29 ENCOUNTER — Ambulatory Visit
Admission: RE | Admit: 2021-07-29 | Discharge: 2021-07-29 | Disposition: A | Discharge status: Home / Self Care | Payer: Medicare Other | Source: Ambulatory Visit | Attending: Family Medicine | Admitting: Family Medicine

## 2021-07-29 DIAGNOSIS — E079 Disorder of thyroid, unspecified: Secondary | ICD-10-CM

## 2021-07-29 NOTE — Telephone Encounter (Signed)
Patient is aware of lab results without questions

## 2021-07-31 ENCOUNTER — Other Ambulatory Visit: Payer: Self-pay | Admitting: Family Medicine

## 2021-07-31 DIAGNOSIS — E041 Nontoxic single thyroid nodule: Secondary | ICD-10-CM

## 2021-07-31 NOTE — Progress Notes (Signed)
fna

## 2021-08-01 ENCOUNTER — Other Ambulatory Visit: Payer: Self-pay | Admitting: Family Medicine

## 2021-08-01 DIAGNOSIS — E041 Nontoxic single thyroid nodule: Secondary | ICD-10-CM

## 2021-08-16 ENCOUNTER — Other Ambulatory Visit: Payer: Self-pay

## 2021-08-16 ENCOUNTER — Emergency Department (HOSPITAL_COMMUNITY): Payer: Medicare Other

## 2021-08-16 ENCOUNTER — Emergency Department (HOSPITAL_COMMUNITY)
Admission: EM | Admit: 2021-08-16 | Discharge: 2021-08-16 | Disposition: A | Discharge status: Home / Self Care | Payer: Medicare Other | Attending: Emergency Medicine | Admitting: Emergency Medicine

## 2021-08-16 DIAGNOSIS — R509 Fever, unspecified: Secondary | ICD-10-CM | POA: Diagnosis present

## 2021-08-16 DIAGNOSIS — F17210 Nicotine dependence, cigarettes, uncomplicated: Secondary | ICD-10-CM | POA: Diagnosis not present

## 2021-08-16 DIAGNOSIS — U071 COVID-19: Secondary | ICD-10-CM | POA: Diagnosis not present

## 2021-08-16 LAB — URINALYSIS, ROUTINE W REFLEX MICROSCOPIC
Bilirubin Urine: NEGATIVE
Glucose, UA: NEGATIVE mg/dL
Hgb urine dipstick: NEGATIVE
Ketones, ur: NEGATIVE mg/dL
Leukocytes,Ua: NEGATIVE
Nitrite: NEGATIVE
Protein, ur: NEGATIVE mg/dL
Specific Gravity, Urine: 1.002 — ABNORMAL LOW (ref 1.005–1.030)
pH: 7 (ref 5.0–8.0)

## 2021-08-16 LAB — COMPREHENSIVE METABOLIC PANEL
ALT: 40 U/L (ref 0–44)
AST: 28 U/L (ref 15–41)
Albumin: 4.7 g/dL (ref 3.5–5.0)
Alkaline Phosphatase: 94 U/L (ref 38–126)
Anion gap: 7 (ref 5–15)
BUN: 9 mg/dL (ref 6–20)
CO2: 25 mmol/L (ref 22–32)
Calcium: 9.4 mg/dL (ref 8.9–10.3)
Chloride: 107 mmol/L (ref 98–111)
Creatinine, Ser: 0.66 mg/dL (ref 0.44–1.00)
GFR, Estimated: >60 mL/min (ref >60)
Glucose, Bld: 98 mg/dL (ref 70–99)
Potassium: 3.4 mmol/L — ABNORMAL LOW (ref 3.5–5.1)
Sodium: 139 mmol/L (ref 135–145)
Total Bilirubin: 0.5 mg/dL (ref 0.3–1.2)
Total Protein: 7.7 g/dL (ref 6.5–8.1)

## 2021-08-16 LAB — CBC WITH DIFFERENTIAL/PLATELET
Abs Immature Granulocytes: 0.04 10*3/uL (ref 0.00–0.07)
Basophils Absolute: 0.1 10*3/uL (ref 0.0–0.1)
Basophils Relative: 1 %
Eosinophils Absolute: 0.1 10*3/uL (ref 0.0–0.5)
Eosinophils Relative: 2 %
HCT: 41.1 % (ref 36.0–46.0)
Hemoglobin: 14 g/dL (ref 12.0–15.0)
Immature Granulocytes: 1 %
Lymphocytes Relative: 30 %
Lymphs Abs: 2.4 10*3/uL (ref 0.7–4.0)
MCH: 31.4 pg (ref 26.0–34.0)
MCHC: 34.1 g/dL (ref 30.0–36.0)
MCV: 92.2 fL (ref 80.0–100.0)
Monocytes Absolute: 0.6 10*3/uL (ref 0.1–1.0)
Monocytes Relative: 7 %
Neutro Abs: 4.7 10*3/uL (ref 1.7–7.7)
Neutrophils Relative %: 59 %
Platelets: 419 10*3/uL — ABNORMAL HIGH (ref 150–400)
RBC: 4.46 MIL/uL (ref 3.87–5.11)
RDW: 12.5 % (ref 11.5–15.5)
WBC: 7.9 10*3/uL (ref 4.0–10.5)
nRBC: 0 % (ref 0.0–0.2)

## 2021-08-16 LAB — RESP PANEL BY RT-PCR (FLU A&B, COVID) ARPGX2
Influenza A by PCR: NEGATIVE
Influenza B by PCR: NEGATIVE
SARS Coronavirus 2 by RT PCR: POSITIVE — AB

## 2021-08-16 MED ORDER — SODIUM CHLORIDE 0.9 % IV BOLUS
1000.0000 mL | Freq: Once | INTRAVENOUS | Status: AC
  Administered 2021-08-16: 1000 mL via INTRAVENOUS

## 2021-08-16 MED ORDER — POTASSIUM CHLORIDE CRYS ER 20 MEQ PO TBCR
40.0000 meq | EXTENDED_RELEASE_TABLET | Freq: Once | ORAL | Status: AC
  Administered 2021-08-16: 40 meq via ORAL
  Filled 2021-08-16: qty 2

## 2021-08-16 NOTE — ED Notes (Signed)
Pt ambulatory to restroom

## 2021-08-16 NOTE — ED Triage Notes (Signed)
Pt BIB EMS pt complains of flu like symptoms x 11 days. Pt complains of diarrhea, fever, congestion to her airways.

## 2021-08-16 NOTE — Discharge Instructions (Signed)
Return for any problem.  ?

## 2021-08-16 NOTE — ED Provider Notes (Signed)
[facility]-EMERGENCY DEPT Provider Note   CSN: [id number] Arrival date & time: 08/16/21  0351     History Chief Complaint  Patient presents with   Fever   Diarrhea   Cough    Anne Meyers is a 40 y.o. female.  40 year old female with prior medical history as detailed below presents for evaluation.  Patient reports 9 days of symptoms.  She reports low-grade subjective fever.  She reports mild congestion.  She reports loose watery stool.  She reports multiple sick contacts in her house with similar symptoms.  She denies testing herself for COVID.  She did receive initial COVID-vaccine.  She denies use of booster vaccines.  She denies current pain.  She denies recent fever in the last 48 hours.  She reports that she is taking good p.o. fluids at home and attempt to stay hydrated.  The history is provided by the patient.  Fever Temp source:  Subjective Severity:  Mild Onset quality:  Gradual Duration:  9 days Timing:  Constant Progression:  Improving Chronicity:  New Relieved by:  Nothing Worsened by:  Nothing Associated symptoms: cough and diarrhea   Diarrhea Associated symptoms: fever   Cough Associated symptoms: fever       Past Medical History:  Diagnosis Date   Back pain    Insomnia    Neuropathy    Palpitations    Panic disorder    PTSD (post-traumatic stress disorder)    Seizures (HCC)    'pseudo seizures"    There are no problems to display for this patient.   Past Surgical History:  Procedure Laterality Date   lithrotripsy     TUBAL LIGATION       OB History   No obstetric history on file.     No family history on file.  Social History   Tobacco Use   Smoking status: Every Day    Packs/day: 0.50    Types: Cigarettes   Smokeless tobacco: Never  Vaping Use   Vaping Use: Never used  Substance Use Topics   Alcohol use: Never   Drug use: Never    Home Medications Prior to Admission medications   Medication Sig  Start Date End Date Taking? Authorizing Provider  atorvastatin (LIPITOR) 10 MG tablet Take 10 mg by mouth daily. Patient not taking: Reported on 07/23/2021    [provider]  cetirizine-pseudoephedrine (ZYRTEC-D) 5-120 MG tablet Take 1 tablet by mouth daily. Patient not taking: Reported on 07/23/2021 06/22/21   McDonald, Mia A, PA-C  clonazePAM (KLONOPIN) 1 MG tablet Take 1 tablet (1 mg total) by mouth daily as needed for anxiety. 07/23/21   Georganna Skeans, MD  fluticasone [facility]) 50 MCG/ACT nasal spray Place 1 spray into both nostrils daily. Patient not taking: Reported on 07/23/2021 06/22/21   McDonald, Mia A, PA-C  gabapentin (NEURONTIN) 600 MG tablet Take 600 mg by mouth 3 (three) times daily. Patient not taking: Reported on 07/23/2021    [provider]  hydrOXYzine (ATARAX/VISTARIL) 25 MG tablet Take 1 tablet (25 mg total) by mouth every 6 (six) hours. Patient not taking: Reported on 07/23/2021 07/16/21   Claudette Stapler C, PA-C  lidocaine (LMX) 4 % cream Apply 1 application topically 3 (three) times daily as needed. Patient not taking: Reported on 07/23/2021 09/25/19   Jeanie Sewer, PA-C  Melatonin 10 MG TABS Take 10 mg by mouth at bedtime as needed (sleep aid). Patient not taking: Reported on 07/23/2021    [provider]  methocarbamol (ROBAXIN) 500 MG tablet Take 1 tablet (500 mg total) by mouth 2 (two) times daily. Patient not taking: Reported on 07/23/2021 06/25/21   Mannie Stabile, PA-C  naproxen (NAPROSYN) 500 MG tablet Take 1 tablet (500 mg total) by mouth 2 (two) times daily. Patient not taking: Reported on 07/23/2021 06/25/21   Mannie Stabile, PA-C  QUEtiapine (SEROQUEL) 400 MG tablet Take 1 tablet (400 mg total) by mouth at bedtime. 07/23/21   Georganna Skeans, MD    Allergies    Wellbutrin [bupropion] and Ondansetron hcl  Review of Systems   Review of Systems  Constitutional:  Positive for fever.  Respiratory:  Positive for cough.    Gastrointestinal:  Positive for diarrhea.  All other systems reviewed and are negative.  Physical Exam Updated Vital Signs BP 125/83   Pulse 79   Temp 98.3 F (36.8 C) (Oral)   Resp 16   LMP 08/06/2021   SpO2 97%   Physical Exam Vitals and nursing note reviewed.  Constitutional:      General: She is not in acute distress.    Appearance: Normal appearance. She is well-developed.  HENT:     Head: Normocephalic and atraumatic.  Eyes:     Conjunctiva/sclera: Conjunctivae normal.     Pupils: Pupils are equal, round, and reactive to light.  Cardiovascular:     Rate and Rhythm: Normal rate and regular rhythm.     Heart sounds: Normal heart sounds.  Pulmonary:     Effort: Pulmonary effort is normal. No respiratory distress.     Breath sounds: Normal breath sounds.  Abdominal:     General: There is no distension.     Palpations: Abdomen is soft.     Tenderness: There is no abdominal tenderness.  Musculoskeletal:        General: No deformity. Normal range of motion.     Cervical back: Normal range of motion and neck supple.  Skin:    General: Skin is warm and dry.  Neurological:     General: No focal deficit present.     Mental Status: She is alert and oriented to person, place, and time.    ED Results / Procedures / Treatments   Labs (all labs ordered are listed, but only abnormal results are displayed) Labs Reviewed  CBC WITH DIFFERENTIAL/PLATELET - Abnormal; Notable for the following components:      Result Value   Platelets 419 (*)    All other components within normal limits  COMPREHENSIVE METABOLIC PANEL - Abnormal; Notable for the following components:   Potassium 3.4 (*)    All other components within normal limits  RESP PANEL BY RT-PCR (FLU A&B, COVID) ARPGX2  URINALYSIS, ROUTINE W REFLEX MICROSCOPIC    EKG None  Radiology DG Chest 2 View  Result Date: 08/16/2021 CLINICAL DATA:  Cough congestion without body aches for 9 days. EXAM: CHEST - 2 VIEW  COMPARISON:  07/16/2021 FINDINGS: Normal heart size and mediastinal contours. No acute infiltrate or edema. No effusion or pneumothorax. No acute osseous findings. IMPRESSION: Negative chest. Electronically Signed   By: Tiburcio Pea M.D.   On: 08/16/2021 04:38    Procedures Procedures   Medications Ordered in ED Medications  sodium chloride 0.9 % bolus 1,000 mL (has no administration in time range)  potassium chloride SA (KLOR-CON) CR tablet 40 mEq (has no administration in time range)    ED Course  I have reviewed the triage vital signs and the nursing notes.  Pertinent labs &  imaging results that were available during my care of the patient were reviewed by me and considered in my medical decision making (see chart for details).    MDM Rules/Calculators/A&P                           MDM  MSE complete  Anne Meyers was evaluated in Emergency Department on 08/16/2021 for the symptoms described in the history of present illness. She was evaluated in the context of the global COVID-19 pandemic, which necessitated consideration that the patient might be at risk for infection with the SARS-CoV-2 virus that causes COVID-19. Institutional protocols and algorithms that pertain to the evaluation of patients at risk for COVID-19 are in a state of rapid change based on information released by regulatory bodies including the CDC and federal and state organizations. These policies and algorithms were followed during the patient's care in the ED.  Patient presented with complaint of symptoms likely secondary to viral syndrome.  COVID screen is positive here.  Patient without indication for admission.  Patient without hypoxia or evidence of pneumonia.  Patient with mild hypokalemia which was treated.  Patient feels significantly improved after IV fluids.  Patient does understand need for close follow-up.  Strict return precautions given and understood.   Final Clinical Impression(s) /  ED Diagnoses Final diagnoses:  COVID-19    Rx / DC Orders ED Discharge Orders     None        Wynetta Fines, MD 08/16/21 [phone]

## 2021-09-04 ENCOUNTER — Inpatient Hospital Stay: Admission: RE | Admit: 2021-09-04 | Payer: Medicare Other | Source: Ambulatory Visit

## 2021-10-22 ENCOUNTER — Ambulatory Visit
Admission: RE | Admit: 2021-10-22 | Discharge: 2021-10-22 | Disposition: A | Discharge status: Home / Self Care | Payer: Medicare Other | Source: Ambulatory Visit | Attending: Family Medicine | Admitting: Family Medicine

## 2021-10-22 ENCOUNTER — Other Ambulatory Visit (HOSPITAL_COMMUNITY)
Admission: RE | Admit: 2021-10-22 | Discharge: 2021-10-22 | Disposition: A | Discharge status: Home / Self Care | Payer: Medicare Other | Source: Ambulatory Visit | Attending: Radiology | Admitting: Radiology

## 2021-10-22 DIAGNOSIS — E041 Nontoxic single thyroid nodule: Secondary | ICD-10-CM | POA: Insufficient documentation

## 2021-10-24 ENCOUNTER — Other Ambulatory Visit: Payer: Self-pay | Admitting: Family Medicine

## 2021-10-24 DIAGNOSIS — F419 Anxiety disorder, unspecified: Secondary | ICD-10-CM

## 2021-10-24 LAB — CYTOLOGY - NON PAP

## 2021-10-25 ENCOUNTER — Other Ambulatory Visit: Payer: Self-pay | Admitting: Family Medicine

## 2021-10-25 DIAGNOSIS — F419 Anxiety disorder, unspecified: Secondary | ICD-10-CM

## 2021-10-30 ENCOUNTER — Encounter (HOSPITAL_COMMUNITY): Payer: Self-pay

## 2021-10-30 ENCOUNTER — Emergency Department (HOSPITAL_COMMUNITY)
Admission: EM | Admit: 2021-10-30 | Discharge: 2021-10-30 | Disposition: A | Discharge status: Home / Self Care | Payer: Medicare Other | Attending: Emergency Medicine | Admitting: Emergency Medicine

## 2021-10-30 ENCOUNTER — Other Ambulatory Visit: Payer: Self-pay

## 2021-10-30 DIAGNOSIS — M542 Cervicalgia: Secondary | ICD-10-CM | POA: Diagnosis not present

## 2021-10-30 DIAGNOSIS — G8929 Other chronic pain: Secondary | ICD-10-CM | POA: Diagnosis not present

## 2021-10-30 MED ORDER — DEXAMETHASONE 4 MG PO TABS
10.0000 mg | ORAL_TABLET | Freq: Once | ORAL | Status: AC
  Administered 2021-10-30: 10 mg via ORAL
  Filled 2021-10-30: qty 1

## 2021-10-30 NOTE — ED Notes (Signed)
Dc instructions reviewed with pt no questions or concerns at this time. Will follow up as needed.  

## 2021-10-30 NOTE — Discharge Instructions (Addendum)
Call your primary care doctor or specialist as discussed in the next 2-3 days.   Return immediately back to the ER if:  Your symptoms worsen within the next 12-24 hours. You develop new symptoms such as new fevers, persistent vomiting, new pain, shortness of breath, or new weakness or numbness, or if you have any other concerns.  

## 2021-10-30 NOTE — ED Provider Notes (Signed)
[facility]-EMERGENCY DEPT Provider Note   CSN: [id number] Arrival date & time: 10/30/21  2021     History  Chief Complaint  Patient presents with   Neuro Referral    Neck Pain    Anne Meyers is a 41 y.o. female.  Patient presents with acute exacerbation of chronic neck pain.  Symptoms have been worse now for the past 3 to 4 days.  She states that she had mid neck pain for years if not months.  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  However back in September she had exacerbation of this pain and was seen here in the ER with imaging and ultimately referred for outpatient care.  She returns back today stating that she did not follow-up at the time but would like another referral for outpatient specialist follow-up.  Denies any new numbness or weakness.  Denies any fevers or cough or vomiting or diarrhea denies any specific trauma.  She states that she has sharp neck pain in the midline neck radiating to bilateral shoulders.      Home Medications Prior to Admission medications   Medication Sig Start Date End Date Taking? Authorizing Provider  atorvastatin (LIPITOR) 10 MG tablet Take 10 mg by mouth daily. Patient not taking: Reported on 07/23/2021    [provider]  cetirizine-pseudoephedrine (ZYRTEC-D) 5-120 MG tablet Take 1 tablet by mouth daily. Patient not taking: Reported on 07/23/2021 06/22/21   McDonald, Mia A, PA-C  clonazePAM (KLONOPIN) 1 MG tablet Take 1 tablet (1 mg total) by mouth daily as needed for anxiety. 07/23/21   Georganna Skeans, MD  fluticasone [facility]) 50 MCG/ACT nasal spray Place 1 spray into both nostrils daily. Patient not taking: Reported on 07/23/2021 06/22/21   McDonald, Mia A, PA-C  gabapentin (NEURONTIN) 600 MG tablet Take 600 mg by mouth 3 (three) times daily. Patient not taking: Reported on 07/23/2021     [provider]  hydrOXYzine (ATARAX/VISTARIL) 25 MG tablet Take 1 tablet (25 mg total) by mouth every 6 (six) hours. Patient not taking: Reported on 07/23/2021 07/16/21   Claudette Stapler C, PA-C  lidocaine (LMX) 4 % cream Apply 1 application topically 3 (three) times daily as needed. Patient not taking: Reported on 07/23/2021 09/25/19   Jeanie Sewer, PA-C  Melatonin 10 MG TABS Take 10 mg by mouth at bedtime as needed (sleep aid). Patient not taking: Reported on 07/23/2021    [provider]  methocarbamol (ROBAXIN) 500 MG tablet Take 1 tablet (500 mg total) by mouth 2 (two) times daily. Patient not taking: Reported on 07/23/2021 06/25/21   Mannie Stabile, PA-C  naproxen (NAPROSYN) 500 MG tablet Take 1 tablet (500 mg total) by mouth 2 (two) times daily. Patient not taking: Reported on 07/23/2021 06/25/21   Mannie Stabile, PA-C  QUEtiapine (SEROQUEL) 400 MG tablet Take 1 tablet (400 mg total) by mouth at bedtime. 07/23/21   Georganna Skeans, MD      Allergies    Wellbutrin [bupropion] and Ondansetron hcl    Review of Systems   Review of Systems  Constitutional:  Negative for fever.  HENT:  Negative for ear pain.   Eyes:  Negative for pain.  Respiratory:  Negative for cough.   Cardiovascular:  Negative for chest pain.  Gastrointestinal:  Negative for abdominal pain.  Genitourinary:  Negative for flank pain.  Musculoskeletal:  Negative for back pain.  Skin:  Negative for rash.  Neurological:  Negative for headaches.   Physical Exam Updated Vital Signs BP 123/76 (BP Location: Left Arm)    Pulse 87    Temp 98.5 F (36.9 C) (Oral)    Resp 16    Ht 5\' 4"  (1.626 m)    Wt 77.1 kg    SpO2 95%    BMI 29.18 kg/m  Physical Exam Constitutional:      General: She is not in acute distress.    Appearance: Normal appearance.  HENT:     Head: Normocephalic.     Nose: Nose normal.  Eyes:     Extraocular Movements: Extraocular movements intact.  Cardiovascular:     Rate  and Rhythm: Normal rate.  Pulmonary:     Effort: Pulmonary effort is normal.  Musculoskeletal:        General: Normal range of motion.     Cervical back: Normal range of motion.     Comments: Moderate cervical spine 5, 6, 7 midline tenderness.  No step-offs.  Paraspinal tenderness present in these regions as well.  No T-spine or L-spine tenderness noted.  Patient has near normal full range of motion of the C-spine otherwise however.  Neurological:     General: No focal deficit present.     Mental Status: She is alert. Mental status is at baseline.    ED Results / Procedures / Treatments   Labs (all labs ordered are listed, but only abnormal results are displayed) Labs Reviewed - No data to display  EKG None  Radiology No results found.  Procedures Procedures    Medications Ordered in ED Medications  dexamethasone (DECADRON) tablet 10 mg (has no administration in time range)    ED Course/ Medical Decision Making/ A&P                           Medical Decision Making  No focal neurodeficit noted.  No weakness in grip strength or upper extremity strength noted.  Review of prior records show visit in September with CT spine cervical performed showing no osseous abnormality.  Thyroid nodule noted and the patient states that she has been following up and just recently had a aspiration done and will follow-up with her doctor regarding results.  Will recommend outpatient follow-up with her primary care doctor for appropriate referrals.  I did give her the phone number to neurosurgery for her to call clinic appointment as well.  Advised immediate return for any new numbness weakness worsening symptoms or any additional concerns.        Final Clinical Impression(s) / ED Diagnoses Final diagnoses:  Neck pain    Rx / DC Orders ED Discharge Orders     None         October, MD 10/30/21 2202

## 2021-10-30 NOTE — ED Triage Notes (Signed)
Pt states that she has had neck pain that radiates to her arms x 2 days. Pt states that she has neuro issues and would like a referral because she lost her paperwork from last time.

## 2021-11-12 ENCOUNTER — Ambulatory Visit (INDEPENDENT_AMBULATORY_CARE_PROVIDER_SITE_OTHER): Payer: Medicare Other | Admitting: Family Medicine

## 2021-11-12 ENCOUNTER — Other Ambulatory Visit: Payer: Self-pay

## 2021-11-12 ENCOUNTER — Encounter: Payer: Self-pay | Admitting: Family Medicine

## 2021-11-12 VITALS — BP 111/76 | HR 84 | Temp 98.2°F | Resp 16 | Wt 184.6 lb

## 2021-11-12 DIAGNOSIS — E041 Nontoxic single thyroid nodule: Secondary | ICD-10-CM

## 2021-11-12 DIAGNOSIS — F419 Anxiety disorder, unspecified: Secondary | ICD-10-CM

## 2021-11-12 MED ORDER — CLONAZEPAM 1 MG PO TABS: 1.0000 mg | ORAL_TABLET | Freq: Every day | ORAL | 0 refills | Status: DC | PRN

## 2021-11-12 MED ORDER — QUETIAPINE FUMARATE 400 MG PO TABS: 400.0000 mg | ORAL_TABLET | Freq: Every day | ORAL | 2 refills | Status: DC

## 2021-11-12 NOTE — Progress Notes (Signed)
Patient said that she need medication refill Patient would like to know thyroid results

## 2021-11-12 NOTE — Progress Notes (Signed)
Established Patient Office Visit  Subjective:  Patient ID: Anne Meyers, female    DOB: 08-25-1981  Age: 40 y.o. MRN: [id number]  CC:  Chief Complaint  Patient presents with   Medication Refill    HPI Anne Meyers presents for follow up. Patient denies acute complaints.   Past Medical History:  Diagnosis Date   Back pain    Insomnia    Neuropathy    Palpitations    Panic disorder    PTSD (post-traumatic stress disorder)    Seizures ([place])    'pseudo seizures"    Past Surgical History:  Procedure Laterality Date   lithrotripsy     TUBAL LIGATION      No family history on file.  Social History   Socioeconomic History   Marital status: Single    Spouse name: Not on file   Number of children: Not on file   Years of education: Not on file   Highest education level: Not on file  Occupational History   Not on file  Tobacco Use   Smoking status: Every Day    Packs/day: 0.50    Types: Cigarettes   Smokeless tobacco: Never  Vaping Use   Vaping Use: Never used  Substance and Sexual Activity   Alcohol use: Never   Drug use: Never   Sexual activity: Not on file  Other Topics Concern   Not on file  Social History Narrative   Not on file   Social Determinants of Health   Financial Resource Strain: Not on file  Food Insecurity: Not on file  Transportation Needs: Not on file  Physical Activity: Not on file  Stress: Not on file  Social Connections: Not on file  Intimate Partner Violence: Not on file    ROS Review of Systems  Psychiatric/Behavioral:  Positive for sleep disturbance. Negative for self-injury and suicidal ideas. The patient is nervous/anxious.   All other systems reviewed and are negative.  Objective:   Today's Vitals: BP 111/76    Pulse 84    Temp 98.2 F (36.8 C) (Oral)    Resp 16    Wt 184 lb 9.6 oz (83.7 kg)    SpO2 98%    BMI 31.69 kg/m   Physical Exam Vitals and nursing note reviewed.  Constitutional:      General: She is not  in acute distress. Neck:     Thyroid: Thyroid mass present.  Cardiovascular:     Rate and Rhythm: Normal rate and regular rhythm.  Pulmonary:     Effort: Pulmonary effort is normal.     Breath sounds: Normal breath sounds.  Abdominal:     Palpations: Abdomen is soft.     Tenderness: There is no abdominal tenderness.  Musculoskeletal:     Cervical back: Normal range of motion and neck supple. No tenderness.  Neurological:     General: No focal deficit present.     Mental Status: She is alert and oriented to person, place, and time.    Assessment & Plan:   1. Anxiety Appears stable. Clonazepam and seroquel refilled.  - clonazePAM (KLONOPIN) 1 MG tablet; Take 1 tablet (1 mg total) by mouth daily as needed for anxiety.  Dispense: 30 tablet; Refill: 0  2. Thyroid nodule Biopsy benign and labs wnl with no sx. Will monitor as recommended.     Outpatient Encounter Medications as of 11/12/2021  Medication Sig   atorvastatin (LIPITOR) 10 MG tablet Take 10 mg by mouth daily.   cetirizine-pseudoephedrine (  ZYRTEC-D) 5-120 MG tablet Take 1 tablet by mouth daily.   clonazePAM (KLONOPIN) 1 MG tablet Take 1 tablet (1 mg total) by mouth daily as needed for anxiety.   fluticasone (FLONASE) 50 MCG/ACT nasal spray Place 1 spray into both nostrils daily.   gabapentin (NEURONTIN) 600 MG tablet Take 600 mg by mouth 3 (three) times daily.   hydrOXYzine (ATARAX/VISTARIL) 25 MG tablet Take 1 tablet (25 mg total) by mouth every 6 (six) hours.   lidocaine (LMX) 4 % cream Apply 1 application topically 3 (three) times daily as needed.   Melatonin 10 MG TABS Take 10 mg by mouth at bedtime as needed (sleep aid).   methocarbamol (ROBAXIN) 500 MG tablet Take 1 tablet (500 mg total) by mouth 2 (two) times daily.   naproxen (NAPROSYN) 500 MG tablet Take 1 tablet (500 mg total) by mouth 2 (two) times daily.   QUEtiapine (SEROQUEL) 400 MG tablet Take 1 tablet (400 mg total) by mouth at bedtime.   No  facility-administered encounter medications on file as of 11/12/2021.    Follow-up: No follow-ups on file.   Becky Sax, MD

## 2022-01-22 ENCOUNTER — Encounter: Payer: Self-pay | Admitting: Physician Assistant

## 2022-01-22 ENCOUNTER — Ambulatory Visit (INDEPENDENT_AMBULATORY_CARE_PROVIDER_SITE_OTHER): Payer: Medicare Other | Admitting: Physician Assistant

## 2022-01-22 VITALS — BP 130/80 | HR 77 | Temp 98.7°F | Resp 18 | Ht 64.0 in | Wt 182.0 lb

## 2022-01-22 DIAGNOSIS — M79604 Pain in right leg: Secondary | ICD-10-CM

## 2022-01-22 DIAGNOSIS — E6609 Other obesity due to excess calories: Secondary | ICD-10-CM

## 2022-01-22 DIAGNOSIS — E079 Disorder of thyroid, unspecified: Secondary | ICD-10-CM

## 2022-01-22 DIAGNOSIS — F17210 Nicotine dependence, cigarettes, uncomplicated: Secondary | ICD-10-CM | POA: Diagnosis not present

## 2022-01-22 DIAGNOSIS — Z720 Tobacco use: Secondary | ICD-10-CM

## 2022-01-22 DIAGNOSIS — E66811 Obesity, class 1: Secondary | ICD-10-CM

## 2022-01-22 DIAGNOSIS — M5412 Radiculopathy, cervical region: Secondary | ICD-10-CM

## 2022-01-22 DIAGNOSIS — Z716 Tobacco abuse counseling: Secondary | ICD-10-CM | POA: Diagnosis not present

## 2022-01-22 DIAGNOSIS — M79605 Pain in left leg: Secondary | ICD-10-CM | POA: Diagnosis not present

## 2022-01-22 DIAGNOSIS — M542 Cervicalgia: Secondary | ICD-10-CM

## 2022-01-22 DIAGNOSIS — Z6831 Body mass index (BMI) 31.0-31.9, adult: Secondary | ICD-10-CM

## 2022-01-22 DIAGNOSIS — E876 Hypokalemia: Secondary | ICD-10-CM

## 2022-01-22 DIAGNOSIS — F419 Anxiety disorder, unspecified: Secondary | ICD-10-CM

## 2022-01-22 DIAGNOSIS — E041 Nontoxic single thyroid nodule: Secondary | ICD-10-CM

## 2022-01-22 MED ORDER — NICOTINE 21 MG/24HR TD PT24: 21.0000 mg | MEDICATED_PATCH | Freq: Every day | TRANSDERMAL | 0 refills | Status: AC

## 2022-01-22 MED ORDER — HYDROXYZINE HCL 25 MG PO TABS: 25.0000 mg | ORAL_TABLET | Freq: Three times a day (TID) | ORAL | 1 refills | Status: DC | PRN

## 2022-01-22 NOTE — Progress Notes (Signed)
Patient has not eaten today only coffee this morning. Patient has not taken medication today. ?Patient reports bilateral leg swelling and pain over the past 2 weeks with a Hx of the concern. Patient reports legs are tender to touch and burn intermittently. ? ?

## 2022-01-22 NOTE — Progress Notes (Signed)
? ?Established Patient Office Visit ? ?Subjective:  ?Patient ID: Anne Meyers, female    DOB: 11/19/1980  Age: 41 y.o. MRN: [id number] ? ?CC:  ?Chief Complaint  ?Patient presents with  ? Leg Swelling  ?  Bilateral   ? ? ?HPI ?Anne Meyers reports that she has been having bilateral leg pain, and swelling.  States that it has been going on for the past 2 weeks.  States that it will be tender to touch at times.  States that her legs feel achy and when she walks they feel heavy.  States that she works at [employer] and does a lot of walking, also when she sits her stool tends to cause her feet to have to dangle.  Otherwise denies injury or trauma ? ?States that she has a history of neck pain, states that she used to take gabapentin for her pain, but did not like having to take it all the time and did not like how she felt when she was taking it intermittently.  States that she feels her neck pain has become worse, is unsure if it has anything to do with her thyroid nodule. ? ?States that she does suffer from anxiety, states that has become worse since relocating to [place].  States that she was prescribed Klonopin by her primary care provider Dr. Redmond Pulling, states that her provider wanted her to make her prescription last for 3 months, states that she does not want to take the Klonopin on a daily basis but has had to use it more often.  States that she has previously been successful using hydroxyzine. ? ?States that she does want to quit smoking, is smoking approximately 17 cigarettes a day, states that she smokes to help her with her anxiety and stress.  Is unable to tolerate Wellbutrin. ? ? ? ?Past Medical History:  ?Diagnosis Date  ? Back pain   ? Insomnia   ? Neuropathy   ? Palpitations   ? Panic disorder   ? PTSD (post-traumatic stress disorder)   ? Seizures ([place])   ? 'pseudo seizures"  ? ? ?Past Surgical History:  ?Procedure Laterality Date  ? lithrotripsy    ? TUBAL LIGATION    ? ? ?History reviewed. No  pertinent family history. ? ?Social History  ? ?Socioeconomic History  ? Marital status: Single  ?  Spouse name: Not on file  ? Number of children: Not on file  ? Years of education: Not on file  ? Highest education level: Not on file  ?Occupational History  ? Not on file  ?Tobacco Use  ? Smoking status: Every Day  ?  Packs/day: 0.50  ?  Types: Cigarettes  ? Smokeless tobacco: Never  ?Vaping Use  ? Vaping Use: Never used  ?Substance and Sexual Activity  ? Alcohol use: Never  ? Drug use: Never  ? Sexual activity: Not Currently  ?Other Topics Concern  ? Not on file  ?Social History Narrative  ? Not on file  ? ?Social Determinants of Health  ? ?Financial Resource Strain: Not on file  ?Food Insecurity: Not on file  ?Transportation Needs: Not on file  ?Physical Activity: Not on file  ?Stress: Not on file  ?Social Connections: Not on file  ?Intimate Partner Violence: Not on file  ? ? ?Outpatient Medications Prior to Visit  ?Medication Sig Dispense Refill  ? clonazePAM (KLONOPIN) 1 MG tablet Take 1 tablet (1 mg total) by mouth daily as needed for anxiety. 30 tablet 0  ?  QUEtiapine (SEROQUEL) 400 MG tablet Take 1 tablet (400 mg total) by mouth at bedtime. 30 tablet 2  ? atorvastatin (LIPITOR) 10 MG tablet Take 10 mg by mouth daily. (Patient not taking: Reported on 01/22/2022)    ? gabapentin (NEURONTIN) 600 MG tablet Take 600 mg by mouth 3 (three) times daily. (Patient not taking: Reported on 01/22/2022)    ? cetirizine-pseudoephedrine (ZYRTEC-D) 5-120 MG tablet Take 1 tablet by mouth daily. 30 tablet 0  ? fluticasone (FLONASE) 50 MCG/ACT nasal spray Place 1 spray into both nostrils daily. 9.9 mL 2  ? hydrOXYzine (ATARAX/VISTARIL) 25 MG tablet Take 1 tablet (25 mg total) by mouth every 6 (six) hours. 12 tablet 0  ? lidocaine (LMX) 4 % cream Apply 1 application topically 3 (three) times daily as needed. 30 g 0  ? Melatonin 10 MG TABS Take 10 mg by mouth at bedtime as needed (sleep aid).    ? methocarbamol (ROBAXIN) 500 MG tablet  Take 1 tablet (500 mg total) by mouth 2 (two) times daily. 20 tablet 0  ? naproxen (NAPROSYN) 500 MG tablet Take 1 tablet (500 mg total) by mouth 2 (two) times daily. 30 tablet 0  ? ?No facility-administered medications prior to visit.  ? ? ?Allergies  ?Allergen Reactions  ? Iodinated Contrast Media Hives  ?  hives  ? Wellbutrin [Bupropion] Other (See Comments)  ?  seizures  ? Metoclopramide Anxiety  ? Ondansetron Hcl Anxiety  ? ? ?ROS ?Review of Systems  ?Constitutional: Negative.   ?HENT: Negative.    ?Eyes: Negative.   ?Respiratory:  Negative for shortness of breath.   ?Cardiovascular:  Positive for leg swelling. Negative for chest pain and palpitations.  ?Gastrointestinal: Negative.   ?Endocrine: Negative.   ?Genitourinary: Negative.   ?Musculoskeletal:  Positive for arthralgias and neck pain.  ?Skin: Negative.   ?Allergic/Immunologic: Negative.   ?Neurological: Negative.   ?Hematological: Negative.   ?Psychiatric/Behavioral:  Negative for dysphoric mood, self-injury, sleep disturbance and suicidal ideas. The patient is nervous/anxious.   ? ?  ?Objective:  ?  ?Physical Exam ?Vitals and nursing note reviewed.  ?Constitutional:   ?   Appearance: Normal appearance.  ?HENT:  ?   Head: Normocephalic and atraumatic.  ?   Right Ear: External ear normal.  ?   Left Ear: External ear normal.  ?   Nose: Nose normal.  ?   Mouth/Throat:  ?   Mouth: Mucous membranes are moist.  ?   Pharynx: Oropharynx is clear.  ?Eyes:  ?   Extraocular Movements: Extraocular movements intact.  ?   Conjunctiva/sclera: Conjunctivae normal.  ?   Pupils: Pupils are equal, round, and reactive to light.  ?Cardiovascular:  ?   Rate and Rhythm: Normal rate and regular rhythm.  ?   Pulses: Normal pulses.     ?     Dorsalis pedis pulses are 2+ on the right side and 2+ on the left side.  ?     Posterior tibial pulses are 2+ on the right side and 2+ on the left side.  ?   Heart sounds: Normal heart sounds.  ?Pulmonary:  ?   Effort: Pulmonary effort is  normal.  ?   Breath sounds: Normal breath sounds.  ?Musculoskeletal:     ?   General: Normal range of motion.  ?   Cervical back: Normal range of motion and neck supple.  ?   Right lower leg: No edema.  ?   Left lower leg: No edema.  ?  Skin: ?   General: Skin is warm and dry.  ?Neurological:  ?   General: No focal deficit present.  ?   Mental Status: She is alert and oriented to person, place, and time.  ?Psychiatric:     ?   Mood and Affect: Mood normal.     ?   Behavior: Behavior normal.     ?   Thought Content: Thought content normal.     ?   Judgment: Judgment normal.  ? ? ?BP 130/80 (BP Location: Left Arm, Patient Position: Sitting, Cuff Size: Normal)   Pulse 77   Temp 98.7 ?F (37.1 ?C) (Oral)   Resp 18   Ht 5' 4"  (1.626 m)   Wt 182 lb (82.6 kg)   LMP 01/22/2022   SpO2 97%   BMI 31.24 kg/m?  ?Wt Readings from Last 3 Encounters:  ?01/22/22 182 lb (82.6 kg)  ?11/12/21 184 lb 9.6 oz (83.7 kg)  ?10/30/21 170 lb (77.1 kg)  ? ? ? ?Health Maintenance Due  ?Topic Date Due  ? HIV Screening  Never done  ? Hepatitis C Screening  Never done  ? TETANUS/TDAP  Never done  ? PAP SMEAR-Modifier  Never done  ? ? ?There are no preventive care reminders to display for this patient. ? ?Lab Results  ?Component Value Date  ? TSH 0.737 07/23/2021  ? ?Lab Results  ?Component Value Date  ? WBC 7.9 08/16/2021  ? HGB 14.0 08/16/2021  ? HCT 41.1 08/16/2021  ? MCV 92.2 08/16/2021  ? PLT 419 (H) 08/16/2021  ? ?Lab Results  ?Component Value Date  ? NA 139 08/16/2021  ? K 3.4 (L) 08/16/2021  ? CO2 25 08/16/2021  ? GLUCOSE 98 08/16/2021  ? BUN 9 08/16/2021  ? CREATININE 0.66 08/16/2021  ? BILITOT 0.5 08/16/2021  ? ALKPHOS 94 08/16/2021  ? AST 28 08/16/2021  ? ALT 40 08/16/2021  ? PROT 7.7 08/16/2021  ? ALBUMIN 4.7 08/16/2021  ? CALCIUM 9.4 08/16/2021  ? ANIONGAP 7 08/16/2021  ? EGFR 116 07/23/2021  ? ?Lab Results  ?Component Value Date  ? CHOL 231 (H) 07/23/2021  ? ?Lab Results  ?Component Value Date  ? HDL 51 07/23/2021  ? ?Lab Results   ?Component Value Date  ? LDLCALC 167 (H) 07/23/2021  ? ?Lab Results  ?Component Value Date  ? TRIG 76 07/23/2021  ? ?Lab Results  ?Component Value Date  ? CHOLHDL 4.5 (H) 07/23/2021  ? ?No results found for: HGBA1

## 2022-01-22 NOTE — Patient Instructions (Signed)
To help with your anxiety, you are going to restart hydroxyzine, I encourage you to take 25 mg in the morning, and then use 1/2-1 full tablet every 8 hours as needed. ? ?To help with smoking cessation, you are going to start using nicotine patch, you will use 21 mg a day for 6 weeks followed by 14 mg a day for 2 weeks and you will finish with 7 mg a day for 2 weeks. ? ?I have started a referral for you to be seen by orthopedics for your neck and upper back pain. ? ?We will call you with today's lab results. ? ?Roney Jaffeari S. Mayers, PA-C ?Physician Assistant ?[facility] ?[email or web address] ? ? ?Managing the Challenge of Quitting Smoking ?Quitting smoking is a physical and mental challenge. You will face cravings, withdrawal symptoms, and temptation. Before quitting, work with your health care provider to make a plan that can help you manage quitting. Preparation can help you quit and keep you from giving in. ?How to manage lifestyle changes ?Managing stress ?Stress can make you want to smoke, and wanting to smoke may cause stress. It is important to find ways to manage your stress. You might try some of the following: ?Practice relaxation techniques. ?Breathe slowly and deeply, in through your nose and out through your mouth. ?Listen to music. ?Soak in a bath or take a shower. ?Imagine a peaceful place or vacation. ?Get some support. ?Talk with family or friends about your stress. ?Join a support group. ?Talk with a counselor or therapist. ?Get some physical activity. ?Go for a walk, run, or bike ride. ?Play a favorite sport. ?Practice yoga. ? ?Medicines ?Talk with your health care provider about medicines that might help you deal with cravings and make quitting easier for you. ?Relationships ?Social situations can be difficult when you are quitting smoking. To manage this, you can: ?Avoid parties and other social situations where people might be smoking. ?Avoid  alcohol. ?Leave right away if you have the urge to smoke. ?Explain to your family and friends that you are quitting smoking. Ask for support and let them know you might be a bit grumpy. ?Plan activities where smoking is not an option. ?General instructions ?Be aware that many people gain weight after they quit smoking. However, not everyone does. To keep from gaining weight, have a plan in place before you quit and stick to the plan after you quit. Your plan should include: ?Having healthy snacks. When you have a craving, it may help to: ?Eat popcorn, carrots, celery, or other cut vegetables. ?Chew sugar-free gum. ?Changing how you eat. ?Eat small portion sizes at meals. ?Eat 4-6 small meals throughout the day instead of 1-2 large meals a day. ?Be mindful when you eat. Do not watch television or do other things that might distract you as you eat. ?Exercising regularly. ?Make time to exercise each day. If you do not have time for a long workout, do short bouts of exercise for 5-10 minutes several times a day. ?Do some form of strengthening exercise, such as weight lifting. ?Do some exercise that gets your heart beating and causes you to breathe deeply, such as walking fast, running, swimming, or biking. This is very important. ?Drinking plenty of water or other low-calorie or no-calorie drinks. Drink 6-8 glasses of water daily. ? ?How to recognize withdrawal symptoms ?Your body and mind may experience discomfort as you try to get used to not having nicotine in your system. These effects are called withdrawal symptoms. They  may include: ?Feeling hungrier than normal. ?Having trouble concentrating. ?Feeling irritable or restless. ?Having trouble sleeping. ?Feeling depressed. ?Craving a cigarette. ?To manage withdrawal symptoms: ?Avoid places, people, and activities that trigger your cravings. ?Remember why you want to quit. ?Get plenty of sleep. ?Avoid coffee and other caffeinated drinks. These may worsen some of your  symptoms. ?These symptoms may surprise you. But be assured that they are normal to have when quitting smoking. ?How to manage cravings ?Come up with a plan for how to deal with your cravings. The plan should include the following: ?A definition of the specific situation you want to deal with. ?An alternative action you will take. ?A clear idea for how this action will help. ?The name of someone who might help you with this. ?Cravings usually last for 5-10 minutes. Consider taking the following actions to help you with your plan to deal with cravings: ?Keep your mouth busy. ?Chew sugar-free gum. ?Suck on hard candies or a straw. ?Brush your teeth. ?Keep your hands and body busy. ?Change to a different activity right away. ?Squeeze or play with a ball. ?Do an activity or a hobby, such as making bead jewelry, practicing needlepoint, or working with wood. ?Mix up your normal routine. ?Take a short exercise break. Go for a quick walk or run up and down stairs. ?Focus on doing something kind or helpful for someone else. ?Call a friend or family member to talk during a craving. ?Join a support group. ?Contact a quitline. ?Where to find support ?To get help or find a support group: ?Call the National Cancer Institute's Smoking Quitline: 1-800-QUIT NOW 907-232-4869) ?Visit the website of the Substance Abuse and Mental Health Services Administration: SkateOasis.com.pt ?Text QUIT to SmokefreeTXT: 182993 ?Where to find more information ?Visit these websites to find more information on quitting smoking: ?National Cancer Institute: www.smokefree.gov ?American Lung Association: www.lung.org ?American Cancer Society: www.cancer.org ?Centers for Disease Control and Prevention: FootballExhibition.com.br ?American Heart Association: www.heart.org ?Contact a health care provider if: ?You want to change your plan for quitting. ?The medicines you are taking are not helping. ?Your eating feels out of control or you cannot sleep. ?Get help right away if: ?You  feel depressed or become very anxious. ?Summary ?Quitting smoking is a physical and mental challenge. You will face cravings, withdrawal symptoms, and temptation to smoke again. Preparation can help you as you go through these challenges. ?Try different techniques to manage stress, handle social situations, and prevent weight gain. ?You can deal with cravings by keeping your mouth busy (such as by chewing gum), keeping your hands and body busy, calling family or friends, or contacting a quitline for people who want to quit smoking. ?You can deal with withdrawal symptoms by avoiding places where people smoke, getting plenty of rest, and avoiding drinks with caffeine. ?This information is not intended to replace advice given to you by your health care provider. Make sure you discuss any questions you have with your health care provider. ?Document Revised: 06/14/2021 Document Reviewed: 07/26/2019 ?Elsevier Patient Education ? 2022 Elsevier Inc. ? ?Peripheral Edema ?Peripheral edema is swelling that is caused by a buildup of fluid. Peripheral edema most often affects the lower legs, ankles, and feet. It can also develop in the arms, hands, and face. The area of the body that has peripheral edema will look swollen. It may also feel heavy or warm. Your clothes may start to feel tight. Pressing on the area may make a temporary dent in your skin. You may not be able  to move your swollen arm or leg as much as usual. ?There are many causes of peripheral edema. It can happen because of a complication of other conditions such as congestive heart failure, kidney disease, or a problem with your blood circulation. It also can be a side effect of certain medicines or because of an infection. It often happens to women during pregnancy. Sometimes, the cause is not known. ?Follow these instructions at home: ?Managing pain, stiffness, and swelling ? ?Raise (elevate) your legs while you are sitting or lying down. ?Move around often to  prevent stiffness and to lessen swelling. ?Do not sit or stand for long periods of time. ?Wear support stockings as told by your health care provider. ?Medicines ?Take over-the-counter and prescription me

## 2022-01-24 DIAGNOSIS — E876 Hypokalemia: Secondary | ICD-10-CM | POA: Insufficient documentation

## 2022-01-24 DIAGNOSIS — E6609 Other obesity due to excess calories: Secondary | ICD-10-CM | POA: Insufficient documentation

## 2022-01-24 DIAGNOSIS — M79605 Pain in left leg: Secondary | ICD-10-CM | POA: Insufficient documentation

## 2022-01-24 DIAGNOSIS — Z720 Tobacco use: Secondary | ICD-10-CM | POA: Insufficient documentation

## 2022-01-24 DIAGNOSIS — F419 Anxiety disorder, unspecified: Secondary | ICD-10-CM | POA: Insufficient documentation

## 2022-01-24 DIAGNOSIS — E079 Disorder of thyroid, unspecified: Secondary | ICD-10-CM | POA: Insufficient documentation

## 2022-01-24 DIAGNOSIS — M5412 Radiculopathy, cervical region: Secondary | ICD-10-CM | POA: Insufficient documentation

## 2022-01-24 DIAGNOSIS — E041 Nontoxic single thyroid nodule: Secondary | ICD-10-CM | POA: Insufficient documentation

## 2022-01-28 LAB — BASIC METABOLIC PANEL
BUN/Creatinine Ratio: 11 (ref 9–23)
BUN: 9 mg/dL (ref 6–20)
CO2: 16 mmol/L — ABNORMAL LOW (ref 20–29)
Calcium: 9.9 mg/dL (ref 8.7–10.2)
Chloride: 104 mmol/L (ref 96–106)
Creatinine, Ser: 0.82 mg/dL (ref 0.57–1.00)
Glucose: 93 mg/dL (ref 70–99)
Potassium: 4.6 mmol/L (ref 3.5–5.2)
Sodium: 140 mmol/L (ref 134–144)
eGFR: 93 mL/min/{1.73_m2} (ref >59)

## 2022-01-28 LAB — TSH: TSH: 1.01 u[IU]/mL (ref 0.450–4.500)

## 2022-01-31 ENCOUNTER — Telehealth: Payer: Self-pay | Admitting: *Deleted

## 2022-01-31 NOTE — Telephone Encounter (Signed)
Medical Assistant left message on patient's home and cell voicemail. ?Voicemail states to give a call back to [place] with [facility] at [phone]. ?Patient viewed results via mychart. ?

## 2022-01-31 NOTE — Telephone Encounter (Signed)
-----   Message from Roney Jaffe, PA-C sent at 01/28/2022  9:31 AM EDT ----- ?Please call patient and let her know that her kidney function is within normal limits, her potassium is back within normal limits as well.  Her thyroid function is within normal limits. ?

## 2022-02-13 ENCOUNTER — Ambulatory Visit (INDEPENDENT_AMBULATORY_CARE_PROVIDER_SITE_OTHER): Payer: Medicare Other | Admitting: Specialist

## 2022-02-13 ENCOUNTER — Encounter: Payer: Self-pay | Admitting: Specialist

## 2022-02-13 ENCOUNTER — Ambulatory Visit: Payer: Self-pay

## 2022-02-13 VITALS — BP 117/79 | HR 76 | Ht 64.0 in | Wt 182.0 lb

## 2022-02-13 DIAGNOSIS — M542 Cervicalgia: Secondary | ICD-10-CM

## 2022-02-13 DIAGNOSIS — M5412 Radiculopathy, cervical region: Secondary | ICD-10-CM

## 2022-02-13 DIAGNOSIS — E079 Disorder of thyroid, unspecified: Secondary | ICD-10-CM | POA: Diagnosis not present

## 2022-02-13 MED ORDER — DICLOFENAC POTASSIUM 50 MG PO TABS: 50.0000 mg | ORAL_TABLET | Freq: Two times a day (BID) | ORAL | 2 refills | Status: DC

## 2022-02-13 MED ORDER — TIZANIDINE HCL 4 MG PO TABS: 4.0000 mg | ORAL_TABLET | Freq: Four times a day (QID) | ORAL | 0 refills | Status: DC | PRN

## 2022-02-13 MED ORDER — GABAPENTIN 100 MG PO CAPS: 100.0000 mg | ORAL_CAPSULE | Freq: Three times a day (TID) | ORAL | 2 refills | Status: DC

## 2022-02-13 NOTE — Progress Notes (Signed)
? ?Office Visit Note ?  ?Patient: Anne Meyers           ?Date of Birth: 1980/11/20           ?MRN: [id number] ?Visit Date: 02/13/2022 ?             ?Requested by: Mayers, Kasandra Knudsen, PA-C ?[id number] ?[address] ?[place],  [place] [address] ?PCP: Georganna Skeans, MD ? ? ?Assessment & Plan: ?Visit Diagnoses:  ?1. Cervicalgia   ?2. Cervical radiculopathy   ?3. Thyroid mass   ? ? ?Avoid overhead lifting and overhead use of the arms. ?Do not lift greater than 5 lbs. ?Adjust head rest in vehicle to prevent hyperextension if rear ended. ?Take extra precautions to avoid falling. ?MRI of the cervical spine ?Referral to ENT specialist due to tracheal shift due to thyroid mass effect left side.  ?Tizanidine 4 mg up to 4 times a day ?Diclofenac 50 mg once a day with meal or snack, can increase up to BID after 4-5 days. ? ?Follow-Up Instructions: Return in about 3 weeks (around 03/06/2022).  ? ?Orders:  ?Orders Placed This Encounter  ?Procedures  ? XR Cervical Spine 2 or 3 views  ? MR Cervical Spine w/o contrast  ? Ambulatory referral to ENT  ? ?Meds ordered this encounter  ?Medications  ? tiZANidine (ZANAFLEX) 4 MG tablet  ?  Sig: Take 1 tablet (4 mg total) by mouth every 6 (six) hours as needed for muscle spasms.  ?  Dispense:  30 tablet  ?  Refill:  0  ? gabapentin (NEURONTIN) 100 MG capsule  ?  Sig: Take 1 capsule (100 mg total) by mouth 3 (three) times daily.  ?  Dispense:  30 capsule  ?  Refill:  2  ? diclofenac (CATAFLAM) 50 MG tablet  ?  Sig: Take 1 tablet (50 mg total) by mouth 2 (two) times daily.  ?  Dispense:  60 tablet  ?  Refill:  2  ? ? ? ? Procedures: ?No procedures performed ? ? ?Clinical Data: ?No additional findings. ? ? ?Subjective: ?Chief Complaint  ?Patient presents with  ? Neck - Pain  ? ? ?41 year old female with history of neck pain and discomfort for about 8 months now. 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 Has headaches that run into the left temple.  ?She has pain that  irritates her at night and she has trouble sleeping. She is trying different things to relieve the pain. 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 Reports that she slipped some discs in the past and had been seen in 2017 and 2019 in [place]. She has been experiencing pain in the left side of the neck with turning the neck to the left and with pain into the left side of the head and face and pain down the left arm into the left elbow and some times into the left fingers. More left side greater than right.Right hand dominant.She is still working in spite of the pain and is working 6 days a week. Leaving a couple of hours early daily due to pain. No gait disturbances and no increased clumbsiness, no dropping of items with the hands.  ? ? ?Review of Systems  ?Constitutional: Negative.   ?HENT: Negative.    ?Eyes: Negative.   ?Respiratory: Negative.    ?Cardiovascular: Negative.   ?Gastrointestinal: Negative.   ?Endocrine: Negative.   ?Genitourinary: Negative.   ?Musculoskeletal:  Positive for neck  pain and neck stiffness. Negative for arthralgias, back pain, gait problem, joint swelling and myalgias.  ?Skin: Negative.  Negative for color change, pallor, rash and wound.  ?Allergic/Immunologic: Negative.  Negative for environmental allergies, food allergies and immunocompromised state.  ?Neurological:  Positive for weakness, numbness and headaches. Negative for dizziness, tremors, seizures, syncope, facial asymmetry, speech difficulty and light-headedness.  ?Hematological: Negative.  Negative for adenopathy. Does not bruise/bleed easily.  ?Psychiatric/Behavioral: Negative.  Negative for agitation, behavioral problems, confusion, decreased concentration, dysphoric mood, hallucinations, self-injury, sleep disturbance and suicidal ideas. The patient is not nervous/anxious and is not hyperactive.   ? ? ?Objective: ?Vital Signs: BP 117/79 (BP Location: Right Arm, Patient Position: Sitting)   Pulse 76   Ht 5\' 4"  (1.626 m)    Wt 182 lb (82.6 kg)   LMP 01/22/2022   BMI 31.24 kg/m?  ? ?Physical Exam ? ?Back Exam  ? ?Tenderness  ?The patient is experiencing tenderness in the cervical and lumbar. ? ?Range of Motion  ?Extension:  abnormal  ?Flexion:  abnormal  ?Lateral bend right:  abnormal  ?Lateral bend left:  abnormal  ?Rotation right:  abnormal  ?Rotation left:  abnormal  ? ? ? ? ?Specialty Comments:  ?No specialty comments available. ? ?Imaging: ?No results found. ? ? ?PMFS History: ?Patient Active Problem List  ? Diagnosis Date Noted  ? Bilateral leg pain 01/24/2022  ? Cervical radiculitis 01/24/2022  ? Anxiety 01/24/2022  ? Thyroid nodule 01/24/2022  ? Thyroid disorder 01/24/2022  ? Tobacco abuse 01/24/2022  ? Hypokalemia 01/24/2022  ? Class 1 obesity due to excess calories with body mass index (BMI) of 31.0 to 31.9 in adult 01/24/2022  ? ?Past Medical History:  ?Diagnosis Date  ? Back pain   ? Insomnia   ? Neuropathy   ? Palpitations   ? Panic disorder   ? PTSD (post-traumatic stress disorder)   ? Seizures ([facility])   ? 'pseudo seizures"  ?  ?No family history on file.  ?Past Surgical History:  ?Procedure Laterality Date  ? lithrotripsy    ? TUBAL LIGATION    ? ?Social History  ? ?Occupational History  ? Not on file  ?Tobacco Use  ? Smoking status: Every Day  ?  Packs/day: 0.50  ?  Types: Cigarettes  ? Smokeless tobacco: Never  ?Vaping Use  ? Vaping Use: Never used  ?Substance and Sexual Activity  ? Alcohol use: Never  ? Drug use: Never  ? Sexual activity: Not Currently  ? ? ? ? ? ? ?

## 2022-02-13 NOTE — Patient Instructions (Addendum)
Avoid overhead lifting and overhead use of the arms. ?Do not lift greater than 5 lbs. ?Adjust head rest in vehicle to prevent hyperextension if rear ended. ?Take extra precautions to avoid falling. ?MRI of the cervical spine ?Referral to ENT specialist due to tracheal shift due to thyroid mass effect left side.  ?Tizanidine 4 mg up to 4 times a day ?Diclofenac 50 mg once a day with meal or snack, can increase up to BID after 4-5 days. ?

## 2022-02-15 ENCOUNTER — Other Ambulatory Visit: Payer: Self-pay | Admitting: Physician Assistant

## 2022-02-15 DIAGNOSIS — F419 Anxiety disorder, unspecified: Secondary | ICD-10-CM

## 2022-02-28 ENCOUNTER — Other Ambulatory Visit: Payer: Medicare Other

## 2022-03-14 ENCOUNTER — Other Ambulatory Visit: Payer: Medicare Other

## 2022-04-11 ENCOUNTER — Telehealth: Payer: Self-pay | Admitting: Family Medicine

## 2022-04-14 ENCOUNTER — Ambulatory Visit: Payer: Medicare Other | Admitting: Physician Assistant

## 2022-04-14 ENCOUNTER — Encounter: Payer: Self-pay | Admitting: Physician Assistant

## 2022-04-14 ENCOUNTER — Ambulatory Visit: Payer: Self-pay

## 2022-04-14 VITALS — BP 115/74 | HR 59 | Resp 18 | Ht 64.0 in | Wt 181.0 lb

## 2022-04-14 DIAGNOSIS — B9689 Other specified bacterial agents as the cause of diseases classified elsewhere: Secondary | ICD-10-CM | POA: Diagnosis not present

## 2022-04-14 DIAGNOSIS — E041 Nontoxic single thyroid nodule: Secondary | ICD-10-CM

## 2022-04-14 DIAGNOSIS — J208 Acute bronchitis due to other specified organisms: Secondary | ICD-10-CM

## 2022-04-14 MED ORDER — FLUTICASONE PROPIONATE 50 MCG/ACT NA SUSP
2.0000 | Freq: Every day | NASAL | 6 refills | Status: DC
Start: 2022-04-14 — End: 2023-04-20

## 2022-04-14 MED ORDER — CETIRIZINE HCL 10 MG PO TABS: 10.0000 mg | ORAL_TABLET | Freq: Every day | ORAL | 11 refills | Status: DC

## 2022-04-14 MED ORDER — AZITHROMYCIN 250 MG PO TABS: ORAL_TABLET | ORAL | 0 refills | Status: DC

## 2022-04-14 NOTE — Telephone Encounter (Signed)
Summary: Swollen neck and thyroid glands advice   Pt is calling to get advice Pt report a cold for 1 week with swollen tyroid and neck. Please advise      Chief Complaint: pain and swelling to neck lymph nodes Symptoms: neck swelling, headache at her temples, feels pressure behind both eyes, stated her neck is abnormally large. Pain from back of ears all the way to thyroid.  Frequency: pain began 4 days ago Pertinent Negatives: Patient denies fever, chest ain, difficulty breathing Disposition: [] ED /[] Urgent Care (no appt availability in office) / [] Appointment(In office/virtual)/ []  [facility]/ [] Home Care/ [] Refused Recommended Disposition /[x] [facility]/ []  Follow-up with PCP Additional Notes: called office no answer. Team message sent to [facility].  Called back office and spoke with Lupita Leash. Advised to send note over, advise pt she can go to mobile bus today and that Dr Andrey Campanile will review chart.  Pt stated she is going to go to bus now. Pt stated no one called form any ENT office.  Reason for Disposition  Tenderness or swelling of front of neck over windpipe  Answer Assessment - Initial Assessment Questions 1. ONSET: "When did the pain begin?"      4 days 2. LOCATION: "Where does it hurt?"      Thyroids, ears behind ears down to neck, eyes sensitive to light, headache to temples to eyes 3. PATTERN "Does the pain come and go, or has it been constant since it started?"      Comes and goes- pressure neck and jaw to eyes are constant 4. SEVERITY: "How bad is the pain?"  (Scale 1-10; or mild, moderate, severe)   - NO PAIN (0): no pain or only slight stiffness    - MILD (1-3): doesn't interfere with normal activities    - MODERATE (4-7): interferes with normal activities or awakens from sleep    - SEVERE (8-10):  excruciating pain, unable to do any normal activities      Moderate 6/10 5. RADIATION: "Does the pain go anywhere else, shoot into your arms?"     Sise of  necks from ears to neck  6. CORD SYMPTOMS: "Any weakness or numbness of the arms or legs?"     Tingling to arms 7. CAUSE: "What do you think is causing the neck pain?"     Thyroid causing the problem 8. NECK OVERUSE: "Any recent activities that involved turning or twisting the neck?"     no 9. OTHER SYMPTOMS: "Do you have any other symptoms?" (e.g., headache, fever, chest pain, difficulty breathing, neck swelling)     Neck swelling, headache, feels like when she swallows it hits something on the way down. Abnormally large neck.  Protocols used: Neck Pain or Stiffness-A-AH

## 2022-04-17 ENCOUNTER — Telehealth: Payer: Self-pay | Admitting: Specialist

## 2022-04-17 NOTE — Telephone Encounter (Signed)
Anne Meyers from [facility] called. Patient is scheduled 6/30 for appointment. They need   Ambulatory referral to ENT. Fax [phone]

## 2022-06-05 ENCOUNTER — Other Ambulatory Visit: Payer: Self-pay | Admitting: Otolaryngology

## 2022-06-06 NOTE — Telephone Encounter (Signed)
Error

## 2022-06-20 ENCOUNTER — Encounter (HOSPITAL_COMMUNITY): Payer: Self-pay

## 2022-06-20 ENCOUNTER — Other Ambulatory Visit: Payer: Self-pay

## 2022-06-20 ENCOUNTER — Emergency Department (HOSPITAL_COMMUNITY)
Admission: EM | Admit: 2022-06-20 | Discharge: 2022-06-20 | Disposition: A | Discharge status: Home / Self Care | Payer: Medicare Other | Attending: Emergency Medicine | Admitting: Emergency Medicine

## 2022-06-20 DIAGNOSIS — M5412 Radiculopathy, cervical region: Secondary | ICD-10-CM | POA: Diagnosis not present

## 2022-06-20 DIAGNOSIS — R200 Anesthesia of skin: Secondary | ICD-10-CM | POA: Insufficient documentation

## 2022-06-20 DIAGNOSIS — Z79899 Other long term (current) drug therapy: Secondary | ICD-10-CM | POA: Diagnosis not present

## 2022-06-20 DIAGNOSIS — M542 Cervicalgia: Secondary | ICD-10-CM | POA: Diagnosis present

## 2022-06-20 MED ORDER — PREDNISONE 10 MG (21) PO TBPK: ORAL_TABLET | Freq: Every day | ORAL | 0 refills | Status: DC

## 2022-06-20 MED ORDER — OXYCODONE-ACETAMINOPHEN 5-325 MG PO TABS: 1.0000 | ORAL_TABLET | Freq: Four times a day (QID) | ORAL | 0 refills | Status: DC | PRN

## 2022-06-20 MED ORDER — DICLOFENAC SODIUM 1 % EX GEL: 4.0000 g | Freq: Four times a day (QID) | CUTANEOUS | 1 refills | Status: DC

## 2022-06-20 NOTE — Discharge Instructions (Addendum)
Please continue to follow-up with Dr. Otelia Sergeant.  I have given you a short course of prednisone to take as prescribed.  I recommend taking Tylenol 1000 mg every 6 hours regularly.  Voltaren gel applied to the back of your neck in the areas of pain in your shoulder can help as well.  Avoid neck extension/looking upwards if you can avoid this.  You can also increase your gabapentin from 100 mg 3 times daily to 200 mg 3 times daily

## 2022-06-20 NOTE — ED Provider Notes (Signed)
[facility]-EMERGENCY DEPT Provider Note   CSN: [id number] Arrival date & time: 06/20/22  0865     History  Chief Complaint  Patient presents with   Numbness   Neck Pain    Anne Meyers is a 41 y.o. female.   Neck Pain Patient is a 41 year old female with past medical history significant for cervical radiculopathy followed by Dr. Otelia Sergeant for this.  She states that she has had continued posterior neck pain.  She states that it radiates into her left arm.  She states that it is a burning tingling sensation.  She denies any numbness or any weakness in upper or lower extremities.  She denies any trauma to her neck or back.  She denies any bowel or bladder incontinence or saddle anesthesia.  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  This seems to be worsening her symptoms.     Home Medications Prior to Admission medications   Medication Sig Start Date End Date Taking? Authorizing Provider  diclofenac Sodium (VOLTAREN) 1 % GEL Apply 4 g topically 4 (four) times daily. 06/20/22  Yes Asuzena Weis, Stevphen Meuse S, PA  oxyCODONE-acetaminophen (PERCOCET/ROXICET) 5-325 MG tablet Take 1 tablet by mouth every 6 (six) hours as needed for severe pain. 06/20/22  Yes Arnett Duddy S, PA  predniSONE (STERAPRED UNI-PAK 21 TAB) 10 MG (21) TBPK tablet Take by mouth daily. Take 6 tabs by mouth daily  for 2 days, then 5 tabs for 2 days, then 4 tabs for 2 days, then 3 tabs for 2 days, 2 tabs for 2 days, then 1 tab by mouth daily for 2 days 06/20/22  Yes Kaarin Pardy S, PA  azithromycin (ZITHROMAX) 250 MG tablet Take 2 tabs PO day 1, then take 1 tab PO once daily 04/14/22   Mayers, Cari S, PA-C  cetirizine (ZYRTEC) 10 MG tablet Take 1 tablet (10 mg total) by mouth daily. 04/14/22   Mayers, Cari S, PA-C  diclofenac (CATAFLAM) 50 MG tablet Take 1 tablet (50 mg total) by mouth 2 (two) times daily. 02/13/22   Kerrin Champagne, MD  fluticasone  (FLONASE) 50 MCG/ACT nasal spray Place 2 sprays into both nostrils daily. 04/14/22   Mayers, Cari S, PA-C  gabapentin (NEURONTIN) 100 MG capsule Take 1 capsule (100 mg total) by mouth 3 (three) times daily. Patient not taking: Reported on 04/14/2022 02/13/22   Kerrin Champagne, MD  hydrOXYzine (ATARAX) 25 MG tablet TAKE 1 TABLET BY MOUTH THREE TIMES A DAY AS NEEDED 02/17/22   Georganna Skeans, MD  QUEtiapine (SEROQUEL) 400 MG tablet Take 1 tablet (400 mg total) by mouth at bedtime. 11/12/21   Georganna Skeans, MD  tiZANidine (ZANAFLEX) 4 MG tablet Take 1 tablet (4 mg total) by mouth every 6 (six) hours as needed for muscle spasms. Patient not taking: Reported on 04/14/2022 02/13/22   Kerrin Champagne, MD      Allergies    Iodinated contrast media, Wellbutrin [bupropion], Metoclopramide, and Ondansetron hcl    Review of Systems   Review of Systems  Musculoskeletal:  Positive for neck pain.    Physical Exam Updated Vital Signs BP (!) 145/85 (BP Location: Right Arm)   Pulse 88   Temp 98.2 F (36.8 C) (Oral)   Resp 18   Ht 5\' 4"  (1.626 m)   Wt 81.6 kg   LMP 06/08/2022 (Approximate)   SpO2 99%   BMI 30.90 kg/m  Physical Exam Vitals and nursing note reviewed.  Constitutional:      General: She is not in acute distress.    Appearance: Normal appearance. She is not ill-appearing.  HENT:     Head: Normocephalic and atraumatic.     Mouth/Throat:     Mouth: Mucous membranes are moist.  Eyes:     General: No scleral icterus.       Right eye: No discharge.        Left eye: No discharge.     Conjunctiva/sclera: Conjunctivae normal.  Pulmonary:     Effort: Pulmonary effort is normal.     Breath sounds: No stridor.  Neurological:     Mental Status: She is alert and oriented to person, place, and time. Mental status is at baseline.     Comments: Alert and oriented to self, place, time and event.   Speech is fluent, clear without dysarthria or dysphasia.   Strength 5/5 in upper/lower extremities    Sensation intact in upper/lower extremities   CN I not tested  CN II grossly intact visual fields bilaterally. Did not visualize posterior eye.  CN III, IV, VI PERRLA and EOMs intact bilaterally  CN V Intact sensation to sharp and light touch to the face  CN VII facial movements symmetric  CN VIII not tested  CN IX, X no uvula deviation, symmetric rise of soft palate  CN XI 5/5 SCM and trapezius strength bilaterally  CN XII Midline tongue protrusion, symmetric L/R movements      ED Results / Procedures / Treatments   Labs (all labs ordered are listed, but only abnormal results are displayed) Labs Reviewed - No data to display  EKG None  Radiology No results found.  Procedures Procedures    Medications Ordered in ED Medications - No data to display  ED Course/ Medical Decision Making/ A&P                           Medical Decision Making Risk Prescription drug management.   Patient is a 41 year old female with past medical history significant for cervical radiculopathy followed by Dr. Louanne Skye for this.  She states that she has had continued posterior neck pain.  She states that it radiates into her left arm.  She states that it is a burning tingling sensation.  She denies any numbness or any weakness in upper or lower extremities.  She denies any trauma to her neck or back.  She denies any bowel or bladder incontinence or saddle anesthesia.  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  This seems to be worsening her symptoms.   Physical exam without any significant abnormal findings. No sensory changes were evident.  Taking gabapentin and muscle relaxer and NSAID.   Patient is neurologically intact.  Her symptoms are chronic in nature.  She is being followed by a specialist already.  I do not suspect that she would benefit from additional work-up here.  Recommend conservative therapy, prednisone for  acute flareup of her cervical radiculopathy, Voltaren gel, and increasing her gabapentin.  2 tablets of percocet given for acute pain today (via prescription)  Final Clinical Impression(s) / ED Diagnoses Final diagnoses:  Cervical radiculopathy    Rx / DC Orders ED Discharge Orders          Ordered    predniSONE (STERAPRED UNI-PAK 21 TAB) 10 MG (  21) TBPK tablet  Daily        06/20/22 0910    diclofenac Sodium (VOLTAREN) 1 % GEL  4 times daily        06/20/22 0910    oxyCODONE-acetaminophen (PERCOCET/ROXICET) 5-325 MG tablet  Every 6 hours PRN        06/20/22 0910              Gailen Shelter, PA 06/20/22 1607    Linwood Dibbles, MD 06/21/22 [phone]

## 2022-06-20 NOTE — ED Triage Notes (Signed)
Patient reports that she is having posterior neck pain and numbness that radiates into the mid back. Patient states she already sees an ortho physician for the same.  Patient states symptoms worsen when she lays too long or stands too long. Patient states she has been taking Gabapentin and Xanaflex with very little relief. Patient also reports that she has had intermittent tingling in her legs.

## 2022-07-22 ENCOUNTER — Ambulatory Visit: Payer: Medicare Other | Admitting: Family Medicine

## 2022-08-01 NOTE — Progress Notes (Signed)
Patient ID: Anne Meyers, female    DOB: 04-Nov-1980  MRN: [id number]  CC: Medication Refill  Subjective: Anne Meyers is a 41 y.o. female who presents for medication refill.   Her concerns today include:  - Presents for refills of Seroquel. States some nights she will take a whole tablet to help with sleep. Reports other nights she will take a half tablet just depends on what she has planned for the next day.  - States bilateral leg pain persisting since 01/22/2022 visit with Maurene Capes, PA. Denies red flag symptoms.    Patient Active Problem List   Diagnosis Date Noted   Bilateral leg pain 01/24/2022   Cervical radiculitis 01/24/2022   Anxiety 01/24/2022   Thyroid nodule 01/24/2022   Thyroid disorder 01/24/2022   Tobacco abuse 01/24/2022   Hypokalemia 01/24/2022   Class 1 obesity due to excess calories with body mass index (BMI) of 31.0 to 31.9 in adult 01/24/2022     Current Outpatient Medications on File Prior to Visit  Medication Sig Dispense Refill   cetirizine (ZYRTEC) 10 MG tablet Take 1 tablet (10 mg total) by mouth daily. (Patient not taking: Reported on 08/06/2022) 30 tablet 11   diclofenac (CATAFLAM) 50 MG tablet Take 1 tablet (50 mg total) by mouth 2 (two) times daily. (Patient not taking: Reported on 08/06/2022) 60 tablet 2   diclofenac Sodium (VOLTAREN) 1 % GEL Apply 4 g topically 4 (four) times daily. (Patient not taking: Reported on 08/06/2022) 50 g 1   fluticasone (FLONASE) 50 MCG/ACT nasal spray Place 2 sprays into both nostrils daily. (Patient not taking: Reported on 08/06/2022) 16 g 6   gabapentin (NEURONTIN) 100 MG capsule Take 1 capsule (100 mg total) by mouth 3 (three) times daily. (Patient not taking: Reported on 04/14/2022) 30 capsule 2   hydrOXYzine (ATARAX) 25 MG tablet TAKE 1 TABLET BY MOUTH THREE TIMES A DAY AS NEEDED (Patient taking differently: Take 25 mg by mouth daily as needed for anxiety.) 270 tablet 1   Multiple Vitamins-Minerals (MULTIVITAMIN  WITH MINERALS) tablet Take 1 tablet by mouth daily.     No current facility-administered medications on file prior to visit.    Allergies  Allergen Reactions   Iodinated Contrast Media Hives    hives   Wellbutrin [Bupropion] Other (See Comments)    seizures   Metoclopramide Anxiety   Ondansetron Hcl Anxiety    Social History   Socioeconomic History   Marital status: Single    Spouse name: Not on file   Number of children: Not on file   Years of education: Not on file   Highest education level: Not on file  Occupational History   Not on file  Tobacco Use   Smoking status: Every Day    Packs/day: 0.50    Types: Cigarettes    Passive exposure: Current   Smokeless tobacco: Never  Vaping Use   Vaping Use: Never used  Substance and Sexual Activity   Alcohol use: Never   Drug use: Never   Sexual activity: Not Currently  Other Topics Concern   Not on file  Social History Narrative   Not on file   Social Determinants of Health   Financial Resource Strain: Not on file  Food Insecurity: Not on file  Transportation Needs: Not on file  Physical Activity: Not on file  Stress: Not on file  Social Connections: Not on file  Intimate Partner Violence: Not on file    No family history on file.  Past Surgical History:  Procedure Laterality Date   lithrotripsy     lithrotripsy     TUBAL LIGATION      ROS: Review of Systems Negative except as stated above  PHYSICAL EXAM: BP 105/72 (BP Location: Left Arm, Patient Position: Sitting, Cuff Size: Large)   Pulse 92   Temp 98.3 F (36.8 C)   Resp 16   Ht 5' 4.02" (1.626 m)   Wt 186 lb (84.4 kg)   SpO2 98%   BMI 31.91 kg/m   Physical Exam HENT:     Head: Normocephalic and atraumatic.  Eyes:     Extraocular Movements: Extraocular movements intact.     Conjunctiva/sclera: Conjunctivae normal.     Pupils: Pupils are equal, round, and reactive to light.  Cardiovascular:     Rate and Rhythm: Normal rate and regular  rhythm.     Pulses: Normal pulses.     Heart sounds: Normal heart sounds.  Pulmonary:     Effort: Pulmonary effort is normal.     Breath sounds: Normal breath sounds.  Musculoskeletal:     Cervical back: Normal range of motion and neck supple.     Right knee: Normal.     Left knee: Normal.     Right lower leg: Normal.     Left lower leg: Normal.     Right ankle: Normal.     Left ankle: Normal.     Right foot: Normal.     Left foot: Normal.  Neurological:     General: No focal deficit present.     Mental Status: She is alert and oriented to person, place, and time.  Psychiatric:        Mood and Affect: Mood normal.        Behavior: Behavior normal.     ASSESSMENT AND PLAN: 1. Anxiety - Patient denies thoughts of self-harm, suicidal ideations, homicidal ideations. - Continue Quetiapine as prescribed.  - Follow-up with primary provider in 3 months or sooner if needed.  - QUEtiapine (SEROQUEL) 400 MG tablet; Take 1 tablet (400 mg total) by mouth at bedtime.  Dispense: 30 tablet; Refill: 2  2. Chronic pain of both lower extremities - Referral to Vascular Surgery for further evaluation and management.  - Ambulatory referral to Vascular Surgery   Patient was given the opportunity to ask questions.  Patient verbalized understanding of the plan and was able to repeat key elements of the plan. Patient was given clear instructions to go to Emergency Department or return to medical center if symptoms don't improve, worsen, or new problems develop.The patient verbalized understanding.   Orders Placed This Encounter  Procedures   Ambulatory referral to Vascular Surgery    Requested Prescriptions   Signed Prescriptions Disp Refills   QUEtiapine (SEROQUEL) 400 MG tablet 30 tablet 2    Sig: Take 1 tablet (400 mg total) by mouth at bedtime.    Return in about 3 months (around 11/08/2022) for Follow-Up or next available chronic care mgmt with Dorna Mai, MD.  Camillia Herter, NP

## 2022-08-08 ENCOUNTER — Encounter: Payer: Self-pay | Admitting: Family

## 2022-08-08 ENCOUNTER — Ambulatory Visit (INDEPENDENT_AMBULATORY_CARE_PROVIDER_SITE_OTHER): Payer: Medicare Other | Admitting: Family

## 2022-08-08 VITALS — BP 105/72 | HR 92 | Temp 98.3°F | Resp 16 | Ht 64.02 in | Wt 186.0 lb

## 2022-08-08 DIAGNOSIS — M79604 Pain in right leg: Secondary | ICD-10-CM

## 2022-08-08 DIAGNOSIS — F419 Anxiety disorder, unspecified: Secondary | ICD-10-CM | POA: Diagnosis not present

## 2022-08-08 DIAGNOSIS — M79605 Pain in left leg: Secondary | ICD-10-CM

## 2022-08-08 DIAGNOSIS — G8929 Other chronic pain: Secondary | ICD-10-CM

## 2022-08-08 MED ORDER — QUETIAPINE FUMARATE 400 MG PO TABS: 400.0000 mg | ORAL_TABLET | Freq: Every day | ORAL | 2 refills | Status: DC

## 2022-08-08 NOTE — Patient Instructions (Signed)
Quetiapine Tablets What is this medication? QUETIAPINE (kwe TYE a peen) treats schizophrenia and bipolar disorder. It works by balancing the levels of dopamine and serotonin in your brain, hormones that help regulate mood, behaviors, and thoughts. It belongs to a group of medications called antipsychotics. Antipsychotic medications can be used to treat several kinds of mental health conditions. This medicine may be used for other purposes; ask your health care provider or pharmacist if you have questions. COMMON BRAND NAME(S): Seroquel What should I tell my care team before I take this medication? They need to know if you have any of these conditions: Blockage in your bowel Cataracts Constipation Dementia Diabetes Difficulty swallowing Glaucoma Heart disease High levels of prolactin History of breast cancer History of irregular heartbeat Liver disease Low blood counts, like low white cell, platelet, or red cell counts Low blood pressure Parkinson's disease Prostate disease Seizures Suicidal thoughts, plans or attempt; a previous suicide attempt by you or a family member Thyroid disease Trouble passing urine An unusual or allergic reaction to quetiapine, other medications, foods, dyes, or preservatives Pregnant or trying to get pregnant Breast-feeding How should I use this medication? Take this medication by mouth. Swallow it with a drink of water. Follow the directions on the prescription label. If it upsets your stomach you can take it with food. Take your medication at regular intervals. Do not take it more often than directed. Do not stop taking except on the advice of your care team. A special MedGuide will be given to you by the pharmacist with each prescription and refill. Be sure to read this information carefully each time. Talk to your care team about the use of this medication in children. While this medication may be prescribed for children as young as 10 years for  selected conditions, precautions do apply. Patients over age 39 years may have a stronger reaction to this medication and need smaller doses. Overdosage: If you think you have taken too much of this medicine contact a poison control center or emergency room at once. NOTE: This medicine is only for you. Do not share this medicine with others. What if I miss a dose? If you miss a dose, take it as soon as you can. If it is almost time for your next dose, take only that dose. Do not take double or extra doses. What may interact with this medication? Do not take this medication with any of the following: Cisapride Dronedarone Metoclopramide Pimozide Thioridazine This medication may also interact with the following: Alcohol Antihistamines for allergy, cough, and cold Atropine Avasimibe Certain antivirals for HIV or hepatitis Certain medications for anxiety or sleep Certain medications for bladder problems like oxybutynin, tolterodine Certain medications for depression like amitriptyline, fluoxetine, nefazodone, sertraline Certain medications for fungal infections like fluconazole, ketoconazole, itraconazole, posaconazole Certain medications for stomach problems like dicyclomine, hyoscyamine Certain medications for travel sickness like scopolamine Cimetidine General anesthetics like halothane, isoflurane, methoxyflurane, propofol Ipratropium Levodopa or other medications for Parkinson's disease Medications for blood pressure Medications for seizures Medications that relax muscles for surgery Narcotic medications for pain Other medications that prolong the QT interval (cause an abnormal heart rhythm) Phenothiazines like chlorpromazine, prochlorperazine Rifampin St. John's Wort This list may not describe all possible interactions. Give your health care provider a list of all the medicines, herbs, non-prescription drugs, or dietary supplements you use. Also tell them if you smoke, drink  alcohol, or use illegal drugs. Some items may interact with your medicine. What should I watch for  while using this medication? Visit your care team for regular checks on your progress. Tell your care team if symptoms do not start to get better or if they get worse. Do not stop taking except on your care team's advice. You may develop a severe reaction. Your care team will tell you how much medication to take. You may need to have an eye exam before and during use of this medication. This medication may increase blood sugar. Ask your care team if changes in diet or medications are needed if you have diabetes. Patients and their families should watch out for new or worsening depression or thoughts of suicide. Also watch out for sudden or severe changes in feelings such as feeling anxious, agitated, panicky, irritable, hostile, aggressive, impulsive, severely restless, overly excited and hyperactive, or not being able to sleep. If this happens, especially at the beginning of antidepressant treatment or after a change in dose, call your care team. You may get dizzy or drowsy. Do not drive, use machinery, or do anything that needs mental alertness until you know how this medication affects you. Do not stand or sit up quickly, especially if you are an older patient. This reduces the risk of dizzy or fainting spells. Alcohol may interfere with the effect of this medication. Avoid alcoholic drinks. This medication can cause problems with controlling your body temperature. It can lower the response of your body to cold temperatures. If possible, stay indoors during cold weather. If you must go outdoors, wear warm clothes. It can also lower the response of your body to heat. Do not overheat. Do not over-exercise. Stay out of the sun when possible. If you must be in the sun, wear cool clothing. Drink plenty of water. If you have trouble controlling your body temperature, call your care team right away. What side  effects may I notice from receiving this medication? Side effects that you should report to your care team as soon as possible: Allergic reactions--skin rash, itching, hives, swelling of the face, lips, tongue, or throat Heart rhythm changes--fast or irregular heartbeat, dizziness, feeling faint or lightheaded, chest pain, trouble breathing High blood sugar (hyperglycemia)--increased thirst or amount of urine, unusual weakness or fatigue, blurry vision High fever, stiff muscles, increased sweating, fast or irregular heartbeat, and confusion, which may be signs of neuroleptic malignant syndrome High prolactin level--unexpected breast tissue growth, discharge from the nipple, change in sex drive or performance, irregular menstrual cycle Increase in blood pressure in children Infection--fever, chills, cough, or sore throat Low blood pressure--dizziness, feeling faint or lightheaded, blurry vision Low thyroid levels (hypothyroidism)--unusual weakness or fatigue, increased sensitivity to cold, constipation, hair loss, dry skin, weight gain, feelings of depression Pain or trouble swallowing Seizures Stroke--sudden numbness or weakness of the face, arm, or leg, trouble speaking, confusion, trouble walking, loss of balance or coordination, dizziness, severe headache, change in vision Sudden eye pain or change in vision such as blurry vision, seeing halos around lights, vision loss Thoughts of suicide or self-harm, worsening mood, feelings of depression Trouble passing urine Uncontrolled and repetitive body movements, muscle stiffness or spasms, tremors or shaking, loss of balance or coordination, restlessness, shuffling walk, which may be signs of extrapyramidal symptoms (EPS) Side effects that usually do not require medical attention (report to your care team if they continue or are bothersome): Constipation Dizziness Drowsiness Dry mouth Weight gain This list may not describe all possible side  effects. Call your doctor for medical advice about side effects. You may report side  effects to FDA at 1-800-FDA-1088. Where should I keep my medication? Keep out of the reach of children. Store at room temperature between 15 and 30 degrees C (59 and 86 degrees F). Throw away any unused medication after the expiration date. NOTE: This sheet is a summary. It may not cover all possible information. If you have questions about this medicine, talk to your doctor, pharmacist, or health care provider.  2023 Elsevier/Gold Standard (2021-01-16 00:00:00)  

## 2022-08-08 NOTE — Progress Notes (Signed)
Pt presents for medication refill -Seroquel

## 2022-08-11 ENCOUNTER — Other Ambulatory Visit: Payer: Self-pay

## 2022-08-11 ENCOUNTER — Encounter (HOSPITAL_COMMUNITY): Payer: Self-pay | Admitting: Otolaryngology

## 2022-08-11 NOTE — Progress Notes (Signed)
PCP - Dorna Mai, MD  Chest x-ray - 08/16/21   ERAS Protcol - Clears until 0430  Anesthesia review: N  Patient verbally denies any shortness of breath, fever, cough and chest pain during phone call   -------------  SDW INSTRUCTIONS given:  Your procedure is scheduled on 08/12/22.  Report to [facility] Main Entrance "A" at 0530 A.M., and check in at the Admitting office.  Call this number if you have problems the morning of surgery:  [phone]   Remember:  Do not eat after midnight the night before your surgery  You may drink clear liquids until 0430 the morning of your surgery.   Clear liquids allowed are: Water, Non-Citrus Juices (without pulp), Carbonated Beverages, Clear Tea, Black Coffee Only, and Gatorade    Take these medicines the morning of surgery with A SIP OF WATER  N/A  As of today, STOP taking any Aspirin (unless otherwise instructed by your surgeon) Aleve, Naproxen, Ibuprofen, Motrin, Advil, Goody's, BC's, all herbal medications, fish oil, and all vitamins.                      Do not wear jewelry, make up, or nail polish            Do not wear lotions, powders, perfumes/colognes, or deodorant.            Do not shave 48 hours prior to surgery.  Men may shave face and neck.            Do not bring valuables to the hospital.            [facility] is not responsible for any belongings or valuables.  Do NOT Smoke (Tobacco/Vaping) 24 hours prior to your procedure If you use a CPAP at night, you may bring all equipment for your overnight stay.   Contacts, glasses, dentures or bridgework may not be worn into surgery.      For patients admitted to the hospital, discharge time will be determined by your treatment team.   Patients discharged the day of surgery will not be allowed to drive home, and someone needs to stay with them for 24 hours.    Special instructions:   [facility]- Preparing For Surgery  Before surgery, you can play an important role.  Because skin is not sterile, your skin needs to be as free of germs as possible. You can reduce the number of germs on your skin by washing with CHG (chlorahexidine gluconate) Soap before surgery.  CHG is an antiseptic cleaner which kills germs and bonds with the skin to continue killing germs even after washing.    Oral Hygiene is also important to reduce your risk of infection.  Remember - BRUSH YOUR TEETH THE MORNING OF SURGERY WITH YOUR REGULAR TOOTHPASTE  Please do not use if you have an allergy to CHG or antibacterial soaps. If your skin becomes reddened/irritated stop using the CHG.  Do not shave (including legs and underarms) for at least 48 hours prior to first CHG shower. It is OK to shave your face.  Please follow these instructions carefully.   Shower the NIGHT BEFORE SURGERY and the MORNING OF SURGERY with DIAL Soap.   Pat yourself dry with a CLEAN TOWEL.  Wear CLEAN PAJAMAS to bed the night before surgery  Place CLEAN SHEETS on your bed the night of your first shower and DO NOT SLEEP WITH PETS.   Day of Surgery: Please shower morning of surgery  Wear Clean/Comfortable clothing the morning of surgery Do not apply any deodorants/lotions.   Remember to brush your teeth WITH YOUR REGULAR TOOTHPASTE.   Questions were answered. Patient verbalized understanding of instructions.

## 2022-08-12 ENCOUNTER — Ambulatory Visit (HOSPITAL_BASED_OUTPATIENT_CLINIC_OR_DEPARTMENT_OTHER): Payer: Medicare Other | Admitting: Certified Registered"

## 2022-08-12 ENCOUNTER — Encounter (HOSPITAL_COMMUNITY)
Admission: RE | Disposition: A | Discharge status: Home / Self Care | Payer: Self-pay | Source: Home / Self Care | Attending: Otolaryngology

## 2022-08-12 ENCOUNTER — Ambulatory Visit (HOSPITAL_COMMUNITY): Payer: Medicare Other | Admitting: Certified Registered"

## 2022-08-12 ENCOUNTER — Observation Stay (HOSPITAL_COMMUNITY)
Admission: RE | Admit: 2022-08-12 | Discharge: 2022-08-13 | Disposition: A | Discharge status: Home / Self Care | Payer: Medicare Other | Attending: Otolaryngology | Admitting: Otolaryngology

## 2022-08-12 ENCOUNTER — Other Ambulatory Visit: Payer: Self-pay

## 2022-08-12 ENCOUNTER — Encounter (HOSPITAL_COMMUNITY): Payer: Self-pay | Admitting: Otolaryngology

## 2022-08-12 DIAGNOSIS — Z79899 Other long term (current) drug therapy: Secondary | ICD-10-CM | POA: Diagnosis not present

## 2022-08-12 DIAGNOSIS — F17210 Nicotine dependence, cigarettes, uncomplicated: Secondary | ICD-10-CM | POA: Insufficient documentation

## 2022-08-12 DIAGNOSIS — E042 Nontoxic multinodular goiter: Secondary | ICD-10-CM

## 2022-08-12 DIAGNOSIS — Z8616 Personal history of COVID-19: Secondary | ICD-10-CM | POA: Insufficient documentation

## 2022-08-12 DIAGNOSIS — Z7951 Long term (current) use of inhaled steroids: Secondary | ICD-10-CM | POA: Diagnosis not present

## 2022-08-12 DIAGNOSIS — E079 Disorder of thyroid, unspecified: Secondary | ICD-10-CM | POA: Diagnosis present

## 2022-08-12 HISTORY — DX: COVID-19: U07.1

## 2022-08-12 HISTORY — DX: Pneumonia, unspecified organism: J18.9

## 2022-08-12 HISTORY — PX: THYROIDECTOMY: SHX17

## 2022-08-12 HISTORY — DX: Anemia, unspecified: D64.9

## 2022-08-12 HISTORY — DX: Personal history of urinary calculi: Z87.442

## 2022-08-12 LAB — CALCIUM
Calcium: 8.8 mg/dL — ABNORMAL LOW (ref 8.9–10.3)
Calcium: 8.9 mg/dL (ref 8.9–10.3)

## 2022-08-12 LAB — BASIC METABOLIC PANEL
Anion gap: 7 (ref 5–15)
BUN: 7 mg/dL (ref 6–20)
CO2: 25 mmol/L (ref 22–32)
Calcium: 9.8 mg/dL (ref 8.9–10.3)
Chloride: 108 mmol/L (ref 98–111)
Creatinine, Ser: 0.76 mg/dL (ref 0.44–1.00)
GFR, Estimated: >60 mL/min (ref >60)
Glucose, Bld: 94 mg/dL (ref 70–99)
Potassium: 4 mmol/L (ref 3.5–5.1)
Sodium: 140 mmol/L (ref 135–145)

## 2022-08-12 LAB — MAGNESIUM: Magnesium: 1.9 mg/dL (ref 1.7–2.4)

## 2022-08-12 LAB — CBC
HCT: 39.7 % (ref 36.0–46.0)
Hemoglobin: 13.6 g/dL (ref 12.0–15.0)
MCH: 32.4 pg (ref 26.0–34.0)
MCHC: 34.3 g/dL (ref 30.0–36.0)
MCV: 94.5 fL (ref 80.0–100.0)
Platelets: 321 10*3/uL (ref 150–400)
RBC: 4.2 MIL/uL (ref 3.87–5.11)
RDW: 12 % (ref 11.5–15.5)
WBC: 6 10*3/uL (ref 4.0–10.5)
nRBC: 0 % (ref 0.0–0.2)

## 2022-08-12 LAB — POCT PREGNANCY, URINE: Preg Test, Ur: NEGATIVE

## 2022-08-12 LAB — ALBUMIN
Albumin: 4 g/dL (ref 3.5–5.0)
Albumin: 3.8 g/dL (ref 3.5–5.0)

## 2022-08-12 SURGERY — THYROIDECTOMY
Anesthesia: General | Site: Neck | Laterality: Bilateral

## 2022-08-12 MED ORDER — CHLORHEXIDINE GLUCONATE 0.12 % MT SOLN
15.0000 mL | Freq: Once | OROMUCOSAL | Status: AC
  Administered 2022-08-12: 15 mL via OROMUCOSAL
  Filled 2022-08-12: qty 15

## 2022-08-12 MED ORDER — LIDOCAINE 2% (20 MG/ML) 5 ML SYRINGE
INTRAMUSCULAR | Status: DC | PRN
  Administered 2022-08-12: 40 mg via INTRAVENOUS

## 2022-08-12 MED ORDER — MEPERIDINE HCL 25 MG/ML IJ SOLN: 6.2500 mg | INTRAMUSCULAR | Status: DC | PRN

## 2022-08-12 MED ORDER — AMISULPRIDE (ANTIEMETIC) 5 MG/2ML IV SOLN: 10.0000 mg | Freq: Once | INTRAVENOUS | Status: DC | PRN

## 2022-08-12 MED ORDER — DEXAMETHASONE SODIUM PHOSPHATE 10 MG/ML IJ SOLN
INTRAMUSCULAR | Status: AC
  Filled 2022-08-12: qty 1

## 2022-08-12 MED ORDER — HYDROMORPHONE HCL 1 MG/ML IJ SOLN
0.5000 mg | INTRAMUSCULAR | Status: DC | PRN
  Administered 2022-08-12: 1 mg via INTRAVENOUS
  Filled 2022-08-12: qty 1

## 2022-08-12 MED ORDER — SUCCINYLCHOLINE CHLORIDE 200 MG/10ML IV SOSY
PREFILLED_SYRINGE | INTRAVENOUS | Status: DC | PRN
  Administered 2022-08-12: 100 mg via INTRAVENOUS

## 2022-08-12 MED ORDER — MIDAZOLAM HCL 2 MG/2ML IJ SOLN
INTRAMUSCULAR | Status: DC | PRN
  Administered 2022-08-12: 2 mg via INTRAVENOUS

## 2022-08-12 MED ORDER — OXYCODONE HCL 5 MG PO TABS
5.0000 mg | ORAL_TABLET | Freq: Once | ORAL | Status: AC | PRN
  Administered 2022-08-12: 5 mg via ORAL

## 2022-08-12 MED ORDER — PROMETHAZINE HCL 25 MG/ML IJ SOLN: 6.2500 mg | INTRAMUSCULAR | Status: DC | PRN

## 2022-08-12 MED ORDER — HYDROMORPHONE HCL 1 MG/ML IJ SOLN
INTRAMUSCULAR | Status: AC
  Filled 2022-08-12: qty 1

## 2022-08-12 MED ORDER — HYDROCODONE-ACETAMINOPHEN 5-325 MG PO TABS
1.0000 | ORAL_TABLET | ORAL | Status: DC | PRN
  Administered 2022-08-12 – 2022-08-13 (×4): 2 via ORAL
  Filled 2022-08-12 (×3): qty 2

## 2022-08-12 MED ORDER — HYDROMORPHONE HCL 1 MG/ML IJ SOLN
INTRAMUSCULAR | Status: AC
  Filled 2022-08-12: qty 0.5

## 2022-08-12 MED ORDER — DEXAMETHASONE SODIUM PHOSPHATE 10 MG/ML IJ SOLN
INTRAMUSCULAR | Status: DC | PRN
  Administered 2022-08-12: 10 mg via INTRAVENOUS

## 2022-08-12 MED ORDER — HYDROMORPHONE HCL 1 MG/ML IJ SOLN
0.5000 mg | INTRAMUSCULAR | Status: AC | PRN
  Administered 2022-08-13 (×2): 1 mg via INTRAVENOUS
  Filled 2022-08-12 (×2): qty 1

## 2022-08-12 MED ORDER — EPINEPHRINE PF 1 MG/ML IJ SOLN
INTRAMUSCULAR | Status: AC
  Filled 2022-08-12: qty 1

## 2022-08-12 MED ORDER — HEMOSTATIC AGENTS (NO CHARGE) OPTIME
TOPICAL | Status: DC | PRN
  Administered 2022-08-12: 1 via TOPICAL

## 2022-08-12 MED ORDER — PROPOFOL 10 MG/ML IV BOLUS
INTRAVENOUS | Status: AC
  Filled 2022-08-12: qty 20

## 2022-08-12 MED ORDER — OYSTER SHELL CALCIUM/D3 500-5 MG-MCG PO TABS
2.0000 | ORAL_TABLET | Freq: Two times a day (BID) | ORAL | Status: DC
  Administered 2022-08-12 – 2022-08-13 (×2): 2 via ORAL
  Filled 2022-08-12 (×2): qty 2

## 2022-08-12 MED ORDER — FENTANYL CITRATE (PF) 250 MCG/5ML IJ SOLN
INTRAMUSCULAR | Status: AC
  Filled 2022-08-12: qty 5

## 2022-08-12 MED ORDER — GABAPENTIN 300 MG PO CAPS
300.0000 mg | ORAL_CAPSULE | Freq: Three times a day (TID) | ORAL | Status: DC
  Filled 2022-08-12: qty 1

## 2022-08-12 MED ORDER — QUETIAPINE FUMARATE 200 MG PO TABS
400.0000 mg | ORAL_TABLET | Freq: Every day | ORAL | Status: DC
  Administered 2022-08-12: 400 mg via ORAL
  Filled 2022-08-12 (×2): qty 2

## 2022-08-12 MED ORDER — FENTANYL CITRATE (PF) 250 MCG/5ML IJ SOLN
INTRAMUSCULAR | Status: DC | PRN
  Administered 2022-08-12: 200 ug via INTRAVENOUS
  Administered 2022-08-12: 50 ug via INTRAVENOUS

## 2022-08-12 MED ORDER — PROMETHAZINE HCL 25 MG PO TABS: 25.0000 mg | ORAL_TABLET | Freq: Four times a day (QID) | ORAL | Status: DC | PRN

## 2022-08-12 MED ORDER — PROMETHAZINE HCL 25 MG RE SUPP: 25.0000 mg | Freq: Four times a day (QID) | RECTAL | Status: DC | PRN

## 2022-08-12 MED ORDER — ACETAMINOPHEN 10 MG/ML IV SOLN
INTRAVENOUS | Status: DC | PRN
  Administered 2022-08-12: 1000 mg via INTRAVENOUS

## 2022-08-12 MED ORDER — CEFAZOLIN SODIUM-DEXTROSE 2-4 GM/100ML-% IV SOLN
2.0000 g | INTRAVENOUS | Status: AC
  Administered 2022-08-12: 2 g via INTRAVENOUS
  Filled 2022-08-12: qty 100

## 2022-08-12 MED ORDER — LEVOTHYROXINE SODIUM 125 MCG PO TABS
125.0000 ug | ORAL_TABLET | Freq: Every day | ORAL | Status: DC
  Administered 2022-08-13: 125 ug via ORAL
  Filled 2022-08-12: qty 1

## 2022-08-12 MED ORDER — OXYCODONE HCL 5 MG/5ML PO SOLN: 5.0000 mg | Freq: Once | ORAL | Status: AC | PRN

## 2022-08-12 MED ORDER — LIDOCAINE 2% (20 MG/ML) 5 ML SYRINGE
INTRAMUSCULAR | Status: AC
  Filled 2022-08-12: qty 5

## 2022-08-12 MED ORDER — SUCCINYLCHOLINE CHLORIDE 200 MG/10ML IV SOSY
PREFILLED_SYRINGE | INTRAVENOUS | Status: AC
  Filled 2022-08-12: qty 10

## 2022-08-12 MED ORDER — HYDROCODONE-ACETAMINOPHEN 5-325 MG PO TABS
ORAL_TABLET | ORAL | Status: AC
  Filled 2022-08-12: qty 2

## 2022-08-12 MED ORDER — OXYCODONE HCL 5 MG PO TABS
ORAL_TABLET | ORAL | Status: AC
  Filled 2022-08-12: qty 1

## 2022-08-12 MED ORDER — ACETAMINOPHEN 10 MG/ML IV SOLN
INTRAVENOUS | Status: AC
  Filled 2022-08-12: qty 100

## 2022-08-12 MED ORDER — LACTATED RINGERS IV SOLN: INTRAVENOUS | Status: DC

## 2022-08-12 MED ORDER — ORAL CARE MOUTH RINSE: 15.0000 mL | Freq: Once | OROMUCOSAL | Status: AC

## 2022-08-12 MED ORDER — PHENYLEPHRINE 80 MCG/ML (10ML) SYRINGE FOR IV PUSH (FOR BLOOD PRESSURE SUPPORT)
PREFILLED_SYRINGE | INTRAVENOUS | Status: DC | PRN
  Administered 2022-08-12: 160 ug via INTRAVENOUS

## 2022-08-12 MED ORDER — PROPOFOL 10 MG/ML IV BOLUS
INTRAVENOUS | Status: DC | PRN
  Administered 2022-08-12: 50 mg via INTRAVENOUS
  Administered 2022-08-12: 150 mg via INTRAVENOUS

## 2022-08-12 MED ORDER — HYDROMORPHONE HCL 1 MG/ML IJ SOLN
INTRAMUSCULAR | Status: DC | PRN
  Administered 2022-08-12: .5 mg via INTRAVENOUS

## 2022-08-12 MED ORDER — DOCUSATE SODIUM 100 MG PO CAPS
100.0000 mg | ORAL_CAPSULE | Freq: Two times a day (BID) | ORAL | Status: DC
  Administered 2022-08-12 – 2022-08-13 (×2): 100 mg via ORAL
  Filled 2022-08-12 (×2): qty 1

## 2022-08-12 MED ORDER — LIDOCAINE-EPINEPHRINE 1 %-1:100000 IJ SOLN
INTRAMUSCULAR | Status: AC
  Filled 2022-08-12: qty 1

## 2022-08-12 MED ORDER — 0.9 % SODIUM CHLORIDE (POUR BTL) OPTIME
TOPICAL | Status: DC | PRN
  Administered 2022-08-12: 1000 mL

## 2022-08-12 MED ORDER — HYDROXYZINE HCL 25 MG PO TABS: 25.0000 mg | ORAL_TABLET | Freq: Every day | ORAL | Status: DC | PRN

## 2022-08-12 MED ORDER — MIDAZOLAM HCL 2 MG/2ML IJ SOLN
INTRAMUSCULAR | Status: AC
  Filled 2022-08-12: qty 2

## 2022-08-12 MED ORDER — HYDROMORPHONE HCL 1 MG/ML IJ SOLN
0.2500 mg | INTRAMUSCULAR | Status: DC | PRN
  Administered 2022-08-12: 0.5 mg via INTRAVENOUS
  Administered 2022-08-12 (×2): 0.25 mg via INTRAVENOUS
  Administered 2022-08-12: 0.5 mg via INTRAVENOUS

## 2022-08-12 MED ORDER — EPINEPHRINE PF 1 MG/ML IJ SOLN
INTRAMUSCULAR | Status: DC | PRN
  Administered 2022-08-12: 1 mg

## 2022-08-12 MED ORDER — ACETAMINOPHEN 500 MG PO TABS
500.0000 mg | ORAL_TABLET | Freq: Three times a day (TID) | ORAL | Status: DC
  Administered 2022-08-12 – 2022-08-13 (×2): 500 mg via ORAL
  Filled 2022-08-12 (×2): qty 1

## 2022-08-12 MED ORDER — LIDOCAINE-EPINEPHRINE 1 %-1:100000 IJ SOLN
INTRAMUSCULAR | Status: DC | PRN
  Administered 2022-08-12: 10 mL

## 2022-08-12 MED ORDER — IBUPROFEN 600 MG PO TABS
600.0000 mg | ORAL_TABLET | Freq: Three times a day (TID) | ORAL | Status: DC
  Administered 2022-08-12 – 2022-08-13 (×2): 600 mg via ORAL
  Filled 2022-08-12 (×2): qty 1

## 2022-08-12 SURGICAL SUPPLY — 54 items
BAG COUNTER SPONGE SURGICOUNT (BAG) ×1 IMPLANT
BLADE SURG 15 STRL LF DISP TIS (BLADE) ×1 IMPLANT
BLADE SURG 15 STRL SS (BLADE) ×1
CANISTER SUCT 3000ML PPV (MISCELLANEOUS) ×1 IMPLANT
CNTNR URN SCR LID CUP LEK RST (MISCELLANEOUS) IMPLANT
CONT SPEC 4OZ STRL OR WHT (MISCELLANEOUS)
CORD BIPOLAR FORCEPS 12FT (ELECTRODE) ×1 IMPLANT
COVER SURGICAL LIGHT HANDLE (MISCELLANEOUS) ×1 IMPLANT
DERMABOND ADVANCED .7 DNX12 (GAUZE/BANDAGES/DRESSINGS) ×1 IMPLANT
DERMABOND ADVANCED .7 DNX6 (GAUZE/BANDAGES/DRESSINGS) IMPLANT
DRAIN JACKSON RD 7FR 3/32 (WOUND CARE) IMPLANT
DRAIN JP 10F RND RADIO (DRAIN) IMPLANT
DRAPE HALF SHEET 40X57 (DRAPES) IMPLANT
ELECT COATED BLADE 2.86 ST (ELECTRODE) ×1 IMPLANT
ELECT REM PT RETURN 9FT ADLT (ELECTROSURGICAL) ×1
ELECTRODE REM PT RTRN 9FT ADLT (ELECTROSURGICAL) ×1 IMPLANT
EVACUATOR SILICONE 100CC (DRAIN) IMPLANT
FORCEPS BIPOLAR SPETZLER 8 1.0 (NEUROSURGERY SUPPLIES) ×1 IMPLANT
GAUZE 4X4 16PLY ~~LOC~~+RFID DBL (SPONGE) ×1 IMPLANT
GLOVE BIO SURGEON STRL SZ7.5 (GLOVE) ×1 IMPLANT
GLOVE SURG ENC MOIS LTX SZ6.5 (GLOVE) ×1 IMPLANT
GOWN STRL REUS W/ TWL LRG LVL3 (GOWN DISPOSABLE) ×2 IMPLANT
GOWN STRL REUS W/TWL LRG LVL3 (GOWN DISPOSABLE) ×2
HEMOSTAT SURGICEL 2X14 (HEMOSTASIS) ×1 IMPLANT
KIT BASIN OR (CUSTOM PROCEDURE TRAY) ×1 IMPLANT
KIT TURNOVER KIT B (KITS) ×1 IMPLANT
LOCATOR NERVE 3 VOLT (DISPOSABLE) IMPLANT
NDL HYPO 25GX1X1/2 BEV (NEEDLE) ×1 IMPLANT
NEEDLE HYPO 25GX1X1/2 BEV (NEEDLE) ×1 IMPLANT
NS IRRIG 1000ML POUR BTL (IV SOLUTION) ×1 IMPLANT
PAD ARMBOARD 7.5X6 YLW CONV (MISCELLANEOUS) ×2 IMPLANT
PATTIES SURGICAL .5 X.5 (GAUZE/BANDAGES/DRESSINGS) IMPLANT
PENCIL SMOKE EVACUATOR (MISCELLANEOUS) ×1 IMPLANT
POSITIONER HEAD DONUT 9IN (MISCELLANEOUS) IMPLANT
PROBE NERVBE PRASS .33 (MISCELLANEOUS) ×1 IMPLANT
SET WALTER ACTIVATION W/DRAPE (SET/KITS/TRAYS/PACK) IMPLANT
SHEARS HARMONIC 9CM CVD (BLADE) ×1 IMPLANT
SPONGE INTESTINAL PEANUT (DISPOSABLE) ×1 IMPLANT
STAPLER VISISTAT 35W (STAPLE) IMPLANT
STRIP CLOSURE SKIN 1/2X4 (GAUZE/BANDAGES/DRESSINGS) IMPLANT
SUT CHROMIC 4 0 PS 2 18 (SUTURE) IMPLANT
SUT MNCRL AB 4-0 PS2 18 (SUTURE) ×1 IMPLANT
SUT SILK 3 0 PS 1 (SUTURE) IMPLANT
SUT SILK 3 0 REEL (SUTURE) ×1 IMPLANT
SUT SILK 3 0SH CR/8 30 (SUTURE) ×1 IMPLANT
SUT VIC AB 3-0 SH 27 (SUTURE) ×1
SUT VIC AB 3-0 SH 27X BRD (SUTURE) ×1 IMPLANT
SUT VIC AB 4-0 PS2 18 (SUTURE) IMPLANT
SUT VICRYL 4-0 PS2 18IN ABS (SUTURE) ×1 IMPLANT
SYR 3ML LL SCALE MARK (SYRINGE) IMPLANT
TOWEL GREEN STERILE FF (TOWEL DISPOSABLE) ×1 IMPLANT
TRAY ENT MC OR (CUSTOM PROCEDURE TRAY) ×1 IMPLANT
TUBE ENDOTRAC EMG 7X10.2 (MISCELLANEOUS) IMPLANT
TUBE FEEDING 10FR FLEXIFLO (MISCELLANEOUS) IMPLANT

## 2022-08-12 NOTE — Transfer of Care (Signed)
Immediate Anesthesia Transfer of Care Note  Patient: Anne Meyers  Procedure(s) Performed: TOTAL THYROIDECTOMY (Bilateral: Neck)  Patient Location: PACU  Anesthesia Type:General  Level of Consciousness: awake, drowsy, patient cooperative and responds to stimulation  Airway & Oxygen Therapy: Patient Spontanous Breathing and Patient connected to nasal cannula oxygen  Post-op Assessment: Report given to RN, Post -op Vital signs reviewed and stable and Patient moving all extremities X 4  Post vital signs: Reviewed and stable  Last Vitals:  Vitals Value Taken Time  BP    Temp    Pulse 102 08/12/22 1146  Resp    SpO2 98 % 08/12/22 1146  Vitals shown include unvalidated device data.  Last Pain:  Vitals:   08/12/22 4627  TempSrc:   PainSc: 0-No pain         Complications: No notable events documented.

## 2022-08-12 NOTE — Progress Notes (Signed)
Patient arrived to [facility] room 29 alert and oriented x4. Pain level 4/10 neck incision clean dry and intact with steri strips ,skin glue, and JP drain in charge position. Patient sitting in recliner. Will continue to monitor pt.

## 2022-08-12 NOTE — Anesthesia Postprocedure Evaluation (Signed)
Anesthesia Post Note  Patient: Anne Meyers  Procedure(s) Performed: TOTAL THYROIDECTOMY (Bilateral: Neck)     Patient location during evaluation: PACU Anesthesia Type: General Level of consciousness: awake and alert Pain management: pain level controlled Vital Signs Assessment: post-procedure vital signs reviewed and stable Respiratory status: spontaneous breathing, nonlabored ventilation and respiratory function stable Cardiovascular status: blood pressure returned to baseline and stable Postop Assessment: no apparent nausea or vomiting Anesthetic complications: no   No notable events documented.  Last Vitals:  Vitals:   08/12/22 1300 08/12/22 1315  BP: 130/86 132/82  Pulse: 82 88  Resp: 16 17  Temp:    SpO2: 93% 93%    Last Pain:  Vitals:   08/12/22 8177  TempSrc:   PainSc: 0-No pain                 Lynda Rainwater

## 2022-08-12 NOTE — Discharge Instructions (Signed)
[facility] Post Operative Instructions Office Number [phone]  The Surgery Itself Thyroid surgery involves general anesthesia. Patients may be quite sedated for several hours after surgery and may remain sleepy for much of the day. Nausea and vomiting is occasionally seen, and usually resolves by the evening of surgery - even without additional medications. Some patients stay one night in the hospital and are discharged the next day. Other patients can go home the evening of surgery. Many patients will have a drain in place after surgery-this is removed the following day before you go home from the hospital or in the office 1-3 days after surgery.   Your Incision Your incision is closed with absorbable sutures and is covered with a small strip of tape or skin glue. You can shower and wash your hair as usual starting 24-48 hours after your drain is removed, as directed by your surgeon. If you did not have a drain in place after surgery, you may shower 24 hours after surgery. You may wash in a bathtub prior to that time if you are careful not to get your neck wet. Do not soak or scrub the incision. You might notice bruising around your incision or upper chest and slight swelling above the scar when you are upright. In addition, the scar may become pink and hard. This hardening will peak at about 3 weeks and may result in some tightness or difficulty swallowing, which will disappear over the next 2 to 3 months. You should apply sunscreen on your incision once directed by your surgeon (usually starting one month  after surgery) EVERY day for the first year after surgery. This will prevent a red or pink scar and give you the best cosmetic result for your scar. A daily moisturizer with sunscreen (example Oil of Olay with SPF 15) is fine.   Limitations You can start resuming normal activities as tolerated 7 days after surgery. For some patients, lifting can cause pain and stretching  at the surgery site for up to 2-3 weeks after surgery. You should not drive or drink alcohol while taking pain medications. Most people can return to work/school in 1-2 weeks after surgery, but there may be physical limitations as far as what you may do while at work.   Medications ? Pain medication should be used for pain as prescribed. Pain is expected after surgery. Your neck will be sore and pain will be worse when the neck is stretched and when you swallow. As the surgical site heals, pain will resolve over the course of a week. It is not uncommon for pain to get worse when you first go home because your activity may increase, but from that point on the pain should improve every day. Pain medications can cause nausea, which can be prevented if you take them with food or milk. ? You may be given a stool softener (Colace) because pain medications may make you  constipated. You can also use an over the counter stool softener.  ? You may be given Bacitracin ointment. If you are, it should be applied to the drain exit site three times a day for 2 days after the drain is removed. It should also be applied to the drain site before and after your first shower after surgery. It is normal to have some red or pink drainage from your drain exit site for 1-2 days after it is removed. ? If you were taking thyroid hormone tablets before your operation, you will continue   this after surgery, but sometimes your surgeon will change the dose. If you were not taking thyroid hormone prior to your operation, your surgeon may prescribe these tablets following surgery if the entire thyroid is removed. During your post-operative visits, you may have a blood test to measure your levels of thyroid hormone and your dose of medication may be adjusted accordingly. ? If you had parathyroid surgery or a total thyroidectomy, you may be instructed to take extra calcium supplements until your blood calcium levels stabilize. These usually  have to be purchased "over the counter" at a drug store and your surgeon will give you specific instructions. Generic brands are fine. Calcium carbonate with Vitamin D or TUMS are usually recommended. If you take any medications for gastric reflux (heartburn), you may be instructed to take Calcium Citrate with vitamin D instead of the other types of calcium. Your surgeon will instruct you on which type of calcium supplement to purchase and how many tablets to take each day after surgery. ? Take all of your routine medications as prescribed, unless told otherwise by your surgeon. Any medications which thin the blood should be avoided until your surgeon tells you to resume them.  ? IT IS OK TO TAKE OVER THE COUNTER PAIN MEDICATION (IBUPROFEN, NAPROXEN, or ACETAMINOPHEN) IN ADDITION TO YOUR PRESCRIBED MEDICATIONS. DO NOT TAKE ASPIRIN UNLESS CLEARED WITH YOUR SURGEON.  ? Limit Acetaminophen/Tylenol to less than 4,000mg/day  ? Limit Ibuprofen/Motrin to less than 3,600mg/day  Pain The main complaint following thyroid surgery is pain with swallowing and neck movement. Some people experience a dull ache, while others feel a sharp pain. This should not keep you from eating anything you want or moving your neck and will improve daily after surgery. It is normal to have a sore throat as well.   Voice Your voice may go through some temporary changes with fluctuations in volume and clarity (hoarseness). Generally, it will be better in the mornings and "tire" toward the end of the day. This can last for variable periods of time, but should clear in 8-10 weeks at most.   Cough You may feel like you have phlegm in your throat or a sore throat. This is usually because there was a tube in your windpipe while you were asleep that caused irritation that you perceive as phlegm. You will notice that if you cough, very little phlegm will come up. This should clear up in 4 to 5 days.  Hypocalcemia (Low blood calcium) In  some patients who have thyroid and parathyroid surgery, the parathyroid glands do not function properly immediately after surgery. This is usually temporary and causes the blood calcium level to drop below normal (hypocalcemia). Symptoms of hypocalcemia include numbness and tingling of your lips (like they fell asleep), in your hands and in your feet. Some patients experience a "crawling" sensation in the skin, muscle cramps or headaches. These symptoms can appear between 24 and 72 hours after surgery. It is rare for them to start more than 72 hours after surgery. If this happens, take 2 extra calcium tablets. If the symptoms do not resolve within one hour, you should call your doctor. If this happens during the business day, call the nurse triage line. If this happens in the evening or over the weekend, call the on call physician or go to the ER so your blood calcium levels can be checked. You can take extra calcium supplements (which will help to resolve symptoms) as you are contacting the clinic or   coming to the ER. Taking extra calcium supplements if you do not need them will not cause you any harm.  Reasons to call your surgeon's office ? Persistent fever over 101 F ? Increasing neck swelling ? Numbness or tingling around your mouth or fingertips  ? Pain that is not relieved by your medications ? Purulent drainage (pus) from the incision ? Redness surrounding the incision that is worsening or getting bigger ? Bleeding is possible after surgery and the most serious cases may cause trouble breathing. Symptoms include rapid swelling in the neck, trouble breathing, red and purple discoloration of the skin over the incision. If breathing difficulty occurs with this rapid neck swelling, call the doctor immediately, go to the closest emergency room or call 911.  

## 2022-08-12 NOTE — H&P (Signed)
Anne Meyers is an 41 y.o. female.    Chief Complaint:  Multinodular goiter  HPI: Patient presents today for planned elective procedure.  She denies any interval change in history since office visit on 04/18/2022:  Anne Meyers is a 41 y.o. female who presents as a new consult, referred by Dorothey Baseman, MD, for evaluation and treatment of nodular thyroid goiter. Patient endorses longstanding history of goiter, but feels as though it has increased in size over the last several years. She also notes symptoms of compression, shortness of breath as well as occasional dysphagia to bulky food items such as meat and bread. She had a CT of her cervical spine for further evaluation of neck pain, which commented on a large left-sided thyroid nodule. A thyroid ultrasound was done for further evaluation, which reported a 6.4 cm TI-RADS 3 nodule in the left gland and a 3.1 TI-RADS 3 nodule in the right gland. Subsequent FNA was consistent with benign follicular process. Patient presents today to discuss option of thyroidectomy. She denies family history of thyroid cancer. She denies personal history of radiation exposure. She is a current every day smoker, with an approximate 10-pack-year history. She is currently working on tobacco cessation.   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 No pertinent family history.  Social History:  reports that she has been smoking cigarettes. She has been smoking an average of .5 packs per day. She has been exposed to tobacco smoke. She has never used smokeless  tobacco. She reports that she does not drink alcohol and does not use drugs.  Allergies:  Allergies  Allergen Reactions   Iodinated Contrast Media Hives    hives   Wellbutrin [Bupropion] Other (See Comments)    seizures   Metoclopramide Anxiety   Ondansetron Hcl Anxiety    Medications Prior to Admission  Medication Sig Dispense Refill   hydrOXYzine (ATARAX) 25 MG tablet TAKE 1 TABLET BY MOUTH THREE TIMES A DAY AS NEEDED (Patient taking differently: Take 25 mg by mouth daily as needed for anxiety.) 270 tablet 1   Multiple Vitamins-Minerals (MULTIVITAMIN WITH MINERALS) tablet Take 1 tablet by mouth daily.     QUEtiapine (SEROQUEL) 400 MG tablet Take 1 tablet (400 mg total) by mouth at bedtime. 30 tablet 2   cetirizine (ZYRTEC) 10 MG tablet Take 1 tablet (10 mg total) by mouth daily. (Patient not taking: Reported on 08/06/2022) 30 tablet 11   diclofenac (CATAFLAM) 50 MG tablet Take 1 tablet (50 mg total) by mouth 2 (two) times daily. (Patient not taking: Reported on 08/06/2022) 60 tablet 2   diclofenac Sodium (VOLTAREN) 1 % GEL Apply 4 g topically 4 (four) times daily. (Patient not taking: Reported on 08/06/2022) 50 g 1   fluticasone (FLONASE) 50 MCG/ACT nasal spray Place 2 sprays into both nostrils daily. (Patient not taking: Reported on 08/06/2022) 16 g 6   gabapentin (NEURONTIN) 100  MG capsule Take 1 capsule (100 mg total) by mouth 3 (three) times daily. (Patient not taking: Reported on 04/14/2022) 30 capsule 2    Results for orders placed or performed during the hospital encounter of 08/12/22 (from the past 48 hour(s))  Pregnancy, urine POC     Status: None   Collection Time: 08/12/22  6:40 AM  Result Value Ref Range   Preg Test, Ur NEGATIVE NEGATIVE    Comment:        THE SENSITIVITY OF THIS METHODOLOGY IS >24 mIU/mL   CBC per protocol     Status: None   Collection Time: 08/12/22  6:47 AM  Result Value Ref Range   WBC 6.0 4.0 - 10.5 K/uL   RBC 4.20 3.87 - 5.11 MIL/uL    Hemoglobin 13.6 12.0 - 15.0 g/dL   HCT 39.7 36.0 - 46.0 %   MCV 94.5 80.0 - 100.0 fL   MCH 32.4 26.0 - 34.0 pg   MCHC 34.3 30.0 - 36.0 g/dL   RDW 12.0 11.5 - 15.5 %   Platelets 321 150 - 400 K/uL   nRBC 0.0 0.0 - 0.2 %    Comment: Performed at [facility], [place] [address] [place], [place] [address]   No results found.  ROS: ROS  Blood pressure 116/73, pulse 73, temperature 97.8 F (36.6 C), temperature source Oral, resp. rate 18, height 5\' 4"  (1.626 m), weight 86.2 kg, SpO2 98 %.  PHYSICAL EXAM: Physical Exam  Studies Reviewed: CT Neck reviewed   Assessment/Plan Anne Meyers is a 41 y.o. female with multinodular goiter  -To OR today for total thyroidectomy. Risks and recovery were again reviewed. All questions answered.   Anne Meyers A Zolton Dowson 08/12/2022, 7:28 AM

## 2022-08-12 NOTE — Anesthesia Procedure Notes (Signed)
Procedure Name: Intubation Date/Time: 08/12/2022 7:52 AM  Performed by: Cathren Harsh, CRNAPre-anesthesia Checklist: Patient identified, Emergency Drugs available, Suction available and Patient being monitored Patient Re-evaluated:Patient Re-evaluated prior to induction Oxygen Delivery Method: Circle System Utilized Preoxygenation: Pre-oxygenation with 100% oxygen Induction Type: IV induction Ventilation: Mask ventilation without difficulty Laryngoscope Size: Glidescope and 3 Grade View: Grade I Tube type: Oral (NIMS tube) Tube size: 7.0 mm Number of attempts: 1 Airway Equipment and Method: Stylet and Oral airway Placement Confirmation: ETT inserted through vocal cords under direct vision, positive ETCO2 and breath sounds checked- equal and bilateral Secured at: 21 cm Tube secured with: Tape Dental Injury: Teeth and Oropharynx as per pre-operative assessment

## 2022-08-12 NOTE — Op Note (Signed)
OPERATIVE NOTE  Anne Meyers Date/Time of Admission: 08/12/2022  5:14 AM  CSN: [id number];MRN:[id number] Attending Provider: Cheron Schaumann A, DO Room/Bed: MCPO/NONE DOB: 09/28/1981 Age: 41 y.o.   Pre-Op Diagnosis: Multinodular goiter  Post-Op Diagnosis: Multinodular goiter  Procedure: Procedure(s): TOTAL THYROIDECTOMY  Anesthesia: General  Surgeon(s): Muzammil Bruins A Channin Agustin, DO  Assistant: Aquilla Hacker, PA-C  Staff: Circulator: Cheri Rous, RN; Jonathon Bellows, RN Physician Assistant: Aquilla Hacker, PA-C Relief Circulator: Donald Pore, RN Relief Scrub: Genella Rife, RN  Implants: * No implants in log *  Specimens: ID Type Source Tests Collected by Time Destination  1 : Left Thyroid Lobe ENT Thyroid, Left Lobe SURGICAL PATHOLOGY Suesan Mohrmann A, DO 08/12/2022 8315   2 : Right Thyroid Lobe and isthmus ENT Thyroid, Right Lobe SURGICAL PATHOLOGY Edell Mesenbrink A, DO 08/12/2022 1026     Complications: None  EBL: 50 ML  Condition: stable  Operative Findings:  3 of  the 4 parathyroid gland were identified and were nerurovascularly intact. The bilateral recurrent laryngeal nerves were intact to visual examination.  Description of Operation: The patient was identified in the preoperative holding area.  Informed written consent including risks, benefits, alternatives, and possible complications including bleeding and nerve injury were obtained.  Patient was then taken, under the care of anesthesia, to the operating suite.  Patient was placed in the supine position on the operating table and then general anesthesia was administered using a NIMS endotracheal tube per anesthesia's protocol.  Incision site on anterior neck was marked and infiltrated with 10cc Lidocaine 1% with 1:100,000 epinephrine.  A transverse incision, approximately 7cm in length was made within a skin crease with a 15 blade scalpel. The incision was carried down through the  platysma. Inferiorly and superiorly based subplatysmal flaps were raised to the level of the clavicle and thyroid cartilage, respectively.  Four 2-0 silk sutures were used to retract the skin flaps at each corner of the incision. The midline raphe of the strap muscles was then identified and separated with the Bovie electrocautery in anatomic midline. The strap muscles were then dissected from the left  thyroid capsule. The strap muscles were retracted laterally. The superior pole vasculature was then identified and surrounding tissue was dissected free using both sharp and blunt instrumentation.  The superior pole vasculature was then clamped, cut, and ligated using Harmonic Scalpel.  The inferior pole was then identified and surrounding tissue were dissected free using both sharp and blunt instrumentation.  The inferior pole vessels were then clamped, cut, and ligated using Harmonic Scalpel. The thyroid was then retracted medially.  The middle thyroid vein was identified and ligated.  The recurrent laryngeal was then identified in the tracheoesophageal groove.  The nerve integrity monitoring probe was then used to stimulate the recurrent laryngeal nerve to ensure its identification and integrity.  The nerve was carefully dissected superiorly until it entered the larynx.  The left   thyroid was removed from the anterior tracheal wall through Berry's Ligament. The thyroid lobe was then passed off the field to be evaluated by pathology as a permanent specimen. Similar procedure was then performed on the right.   Spot bipolar electrocautery was used to achieve meticulous hemostasis, and the dissected nerve was once again stimulated and found to be functional.  The wound bed was copiously irrigated and once again inspected for any bleeding.  There was none.  Surgicel was placed in the wound bed.  A 10 french JP drain was passed through a separate stab  incision inferior to the surgical site and sutured in place using  2-0 silk suture. The strap muscles were then reapproximated using a 3-0 Vicryl suture in a running, locking fashion. The incision was then closed in a layered fashion. The platysma was closed with interrupted 3-0 Vicryl sutures.  The subcutaneous tissue was then closed with buried interrupted 4-0 vicryl sutures and the dermis was approximated with running 4-0 Monocryl suture.  The skin was then closed with Dermabond.  Finally, Steristrips were placed over the incision. All counts were correct and the procedure was terminated.  Assistance was required throughout the surgical procedure including surgical planning, retraction, management of bleeding and surgical decision-making throughout the operation.   Jason Coop, [facility]  08/12/2022

## 2022-08-12 NOTE — Anesthesia Preprocedure Evaluation (Addendum)
Anesthesia Evaluation  Patient identified by MRN, date of birth, ID band Patient awake    Reviewed: Allergy & Precautions, NPO status , Patient's Chart, lab work & pertinent test results  Airway Mallampati: II  TM Distance: >3 FB Neck ROM: Full    Dental  (+) Edentulous Upper, Edentulous Lower   Pulmonary neg pulmonary ROS, Current Smoker and Patient abstained from smoking.,    Pulmonary exam normal breath sounds clear to auscultation       Cardiovascular negative cardio ROS Normal cardiovascular exam Rhythm:Regular Rate:Normal     Neuro/Psych Anxiety negative neurological ROS  negative psych ROS   GI/Hepatic negative GI ROS, Neg liver ROS,   Endo/Other  negative endocrine ROS  Renal/GU negative Renal ROS  negative genitourinary   Musculoskeletal negative musculoskeletal ROS (+)   Abdominal (+) + obese,   Peds negative pediatric ROS (+)  Hematology negative hematology ROS (+)   Anesthesia Other Findings   Reproductive/Obstetrics negative OB ROS                            Anesthesia Physical Anesthesia Plan  ASA: 2  Anesthesia Plan: General   Post-op Pain Management: Dilaudid IV and Ofirmev IV (intra-op)*   Induction: Intravenous  PONV Risk Score and Plan: 2 and Ondansetron, Midazolam and Treatment may vary due to age or medical condition  Airway Management Planned: Oral ETT  Additional Equipment:   Intra-op Plan:   Post-operative Plan: Extubation in OR  Informed Consent: I have reviewed the patients History and Physical, chart, labs and discussed the procedure including the risks, benefits and alternatives for the proposed anesthesia with the patient or authorized representative who has indicated his/her understanding and acceptance.     Dental advisory given  Plan Discussed with: CRNA  Anesthesia Plan Comments:         Anesthesia Quick Evaluation

## 2022-08-13 ENCOUNTER — Encounter (HOSPITAL_COMMUNITY): Payer: Self-pay | Admitting: Otolaryngology

## 2022-08-13 ENCOUNTER — Other Ambulatory Visit (HOSPITAL_COMMUNITY): Payer: Self-pay

## 2022-08-13 DIAGNOSIS — E042 Nontoxic multinodular goiter: Secondary | ICD-10-CM | POA: Diagnosis not present

## 2022-08-13 LAB — PARATHYROID HORMONE, INTACT (NO CA): PTH: 20 pg/mL (ref 15–65)

## 2022-08-13 LAB — CALCIUM: Calcium: 9.2 mg/dL (ref 8.9–10.3)

## 2022-08-13 LAB — ALBUMIN: Albumin: 3.5 g/dL (ref 3.5–5.0)

## 2022-08-13 MED ORDER — CALCIUM CARB-CHOLECALCIFEROL 600-10 MG-MCG PO TABS
2.0000 | ORAL_TABLET | Freq: Two times a day (BID) | ORAL | 0 refills | Status: DC
  Filled 2022-08-13 (×2): qty 40, 10d supply, fill #0

## 2022-08-13 MED ORDER — DOCUSATE SODIUM 100 MG PO CAPS
100.0000 mg | ORAL_CAPSULE | Freq: Two times a day (BID) | ORAL | 0 refills | Status: DC
  Filled 2022-08-13: qty 10, 5d supply, fill #0

## 2022-08-13 MED ORDER — LEVOTHYROXINE SODIUM 125 MCG PO TABS
125.0000 ug | ORAL_TABLET | Freq: Every day | ORAL | 2 refills | Status: DC
  Filled 2022-08-13 – 2022-09-07 (×2): qty 30, 30d supply, fill #0
  Filled 2022-10-09 – 2022-10-10 (×3): qty 30, 30d supply, fill #1

## 2022-08-13 MED ORDER — HYDROCODONE-ACETAMINOPHEN 5-325 MG PO TABS
1.0000 | ORAL_TABLET | Freq: Four times a day (QID) | ORAL | 0 refills | Status: AC | PRN
  Filled 2022-08-13: qty 25, 7d supply, fill #0

## 2022-08-13 NOTE — Progress Notes (Signed)
Pt ready to go once DC'd.

## 2022-08-13 NOTE — Care Management Obs Status (Signed)
[facility] NOTIFICATION   Patient Details  Name: Afomia Blackley MRN: [id number] Date of Birth: 03-01-1981   Medicare Observation Status Notification Given:  Yes    Carles Collet, RN 08/13/2022, 10:24 AM

## 2022-08-13 NOTE — Progress Notes (Signed)
Mobility Specialist - Progress Note   08/13/22 1100  Mobility  Activity Ambulated with assistance in hallway  Level of Assistance Standby assist, set-up cues, supervision of patient - no hands on  Assistive Device None  Distance Ambulated (ft) 300 ft  Activity Response Tolerated well  $Mobility charge 1 Mobility    Pt received in bed agreeable to mobility. Left in room w/ NT present.   Paulla Dolly Mobility Specialist

## 2022-08-13 NOTE — Discharge Summary (Signed)
Physician Discharge Summary  Patient ID: Anne Meyers MRN: [id number] DOB/AGE: 1980-12-13 41 y.o.  Admit date: 08/12/2022 Discharge date: 08/13/2022  Admission Diagnoses:  Principal Problem:   Multinodular goiter Active Problems:   Thyroid mass   Discharge Diagnoses:  Same  Surgeries: Procedure(s): TOTAL THYROIDECTOMY on 08/12/2022   Consultants: None  Discharged Condition: Improved  Hospital Course: Anne Meyers is an 41 y.o. female who was admitted 08/12/2022 with a chief complaint of Multinodular goiter.  They were brought to the operating room on 08/12/2022 and underwent the above named procedures.  Patient was admitted for routine overnight observation. Postop course was uneventful. On POD #1, patient was tolerating PO, ambulating and had normal postop labs. Drain was removed and patient was deemed stable for discharge  Physical Exam:  General: Awake and alert, no acute distress Neck: Midline neck incision C/D with steristrips intact. No evidence of seroma or hematoma. JP drain with SS fluid.  Respiratory: Respiratory effort is normal on room air  Recent vital signs:  Vitals:   08/13/22 0525 08/13/22 0752  BP: 111/63 117/67  Pulse: 62 65  Resp: 16 16  Temp: 98.7 F (37.1 C) 98.2 F (36.8 C)  SpO2: 100% 99%    Recent laboratory studies:  Results for orders placed or performed during the hospital encounter of [id number]  Basic metabolic panel per protocol  Result Value Ref Range   Sodium 140 135 - 145 mmol/L   Potassium 4.0 3.5 - 5.1 mmol/L   Chloride 108 98 - 111 mmol/L   CO2 25 22 - 32 mmol/L   Glucose, Bld 94 70 - 99 mg/dL   BUN 7 6 - 20 mg/dL   Creatinine, Ser 0.76 0.44 - 1.00 mg/dL   Calcium 9.8 8.9 - 10.3 mg/dL   GFR, Estimated >60 >60 mL/min   Anion gap 7 5 - 15  CBC per protocol  Result Value Ref Range   WBC 6.0 4.0 - 10.5 K/uL   RBC 4.20 3.87 - 5.11 MIL/uL   Hemoglobin 13.6 12.0 - 15.0 g/dL   HCT 39.7 36.0 - 46.0 %   MCV 94.5 80.0 - 100.0  fL   MCH 32.4 26.0 - 34.0 pg   MCHC 34.3 30.0 - 36.0 g/dL   RDW 12.0 11.5 - 15.5 %   Platelets 321 150 - 400 K/uL   nRBC 0.0 0.0 - 0.2 %  Parathyroid hormone, intact (no Ca)  Result Value Ref Range   PTH 20 15 - 65 pg/mL  Calcium  Result Value Ref Range   Calcium 8.9 8.9 - 10.3 mg/dL  Albumin  Result Value Ref Range   Albumin 3.8 3.5 - 5.0 g/dL  Calcium  Result Value Ref Range   Calcium 8.8 (L) 8.9 - 10.3 mg/dL  Albumin  Result Value Ref Range   Albumin 4.0 3.5 - 5.0 g/dL  Magnesium  Result Value Ref Range   Magnesium 1.9 1.7 - 2.4 mg/dL  Calcium  Result Value Ref Range   Calcium 9.2 8.9 - 10.3 mg/dL  Albumin  Result Value Ref Range   Albumin 3.5 3.5 - 5.0 g/dL  Pregnancy, urine POC  Result Value Ref Range   Preg Test, Ur NEGATIVE NEGATIVE    Discharge Medications:   Allergies as of 08/13/2022       Reactions   Iodinated Contrast Media Hives   hives   Wellbutrin [bupropion] Other (See Comments)   seizures   Metoclopramide Anxiety   Ondansetron Hcl Anxiety  Medication List     TAKE these medications    Calcium Carb-Cholecalciferol 600-10 MG-MCG Tabs Take 2 tablets by mouth 2 (two) times daily for 10 days.   cetirizine 10 MG tablet Commonly known as: ZYRTEC Take 1 tablet (10 mg total) by mouth daily.   diclofenac 50 MG tablet Commonly known as: CATAFLAM Take 1 tablet (50 mg total) by mouth 2 (two) times daily.   diclofenac Sodium 1 % Gel Commonly known as: Voltaren Apply 4 g topically 4 (four) times daily.   docusate sodium 100 MG capsule Commonly known as: COLACE Take 1 capsule (100 mg total) by mouth 2 (two) times daily.   fluticasone 50 MCG/ACT nasal spray Commonly known as: FLONASE Place 2 sprays into both nostrils daily.   gabapentin 100 MG capsule Commonly known as: NEURONTIN Take 1 capsule (100 mg total) by mouth 3 (three) times daily.   HYDROcodone-acetaminophen 5-325 MG tablet Commonly known as: NORCO/VICODIN Take 1  tablet by mouth every 6 (six) hours as needed for up to 7 days for moderate pain.   hydrOXYzine 25 MG tablet Commonly known as: ATARAX TAKE 1 TABLET BY MOUTH THREE TIMES A DAY AS NEEDED What changed:  when to take this reasons to take this   levothyroxine 125 MCG tablet Commonly known as: SYNTHROID Take 1 tablet (125 mcg total) by mouth daily at 6 (six) AM. Start taking on: August 14, 2022   multivitamin with minerals tablet Take 1 tablet by mouth daily.   QUEtiapine 400 MG tablet Commonly known as: SEROQUEL Take 1 tablet (400 mg total) by mouth at bedtime.        Diagnostic Studies: No results found.  Disposition: Discharge disposition: 01-Home or Self Care          Follow-up Information     Ashley Montminy A, DO Follow up.   Specialty: Otolaryngology Contact information: [place] [address] [phone]                  Signed: Jason Coop 08/13/2022, 1:15 PM

## 2022-08-13 NOTE — Progress Notes (Signed)
Dr. Fredric Dine will be up around 12 following OR to check pt and potentially DC her. Pt already has her TOC meds in the room. Supplies in room for DC of JP drain.

## 2022-08-13 NOTE — Progress Notes (Signed)
Pt requesting TOC pharmacy because she is limited on money. Dr. Fredric Dine notified.

## 2022-08-13 NOTE — Progress Notes (Signed)
Pt ready for DC to home. Has TOC meds and copy of dc instructions, reviewed s/s of infection, bleeding and follow up.

## 2022-08-16 LAB — SURGICAL PATHOLOGY

## 2022-09-01 ENCOUNTER — Other Ambulatory Visit: Payer: Self-pay | Admitting: *Deleted

## 2022-09-01 DIAGNOSIS — M79604 Pain in right leg: Secondary | ICD-10-CM

## 2022-09-05 ENCOUNTER — Encounter: Payer: Medicare Other | Admitting: Vascular Surgery

## 2022-09-05 ENCOUNTER — Ambulatory Visit (HOSPITAL_COMMUNITY): Admission: RE | Admit: 2022-09-05 | Payer: Medicare Other | Source: Ambulatory Visit

## 2022-09-05 ENCOUNTER — Other Ambulatory Visit: Payer: Self-pay | Admitting: *Deleted

## 2022-09-05 NOTE — Progress Notes (Signed)
Chart opened in error

## 2022-09-07 ENCOUNTER — Other Ambulatory Visit (HOSPITAL_COMMUNITY): Payer: Self-pay

## 2022-09-08 ENCOUNTER — Other Ambulatory Visit (HOSPITAL_COMMUNITY): Payer: Self-pay

## 2022-09-08 ENCOUNTER — Other Ambulatory Visit: Payer: Self-pay

## 2022-09-09 ENCOUNTER — Other Ambulatory Visit (HOSPITAL_COMMUNITY): Payer: Self-pay

## 2022-09-10 ENCOUNTER — Other Ambulatory Visit (HOSPITAL_COMMUNITY): Payer: Self-pay

## 2022-09-12 ENCOUNTER — Other Ambulatory Visit (HOSPITAL_COMMUNITY): Payer: Self-pay

## 2022-09-15 ENCOUNTER — Other Ambulatory Visit: Payer: Self-pay | Admitting: Family

## 2022-09-15 DIAGNOSIS — F419 Anxiety disorder, unspecified: Secondary | ICD-10-CM

## 2022-10-09 ENCOUNTER — Other Ambulatory Visit (HOSPITAL_COMMUNITY): Payer: Self-pay

## 2022-10-10 ENCOUNTER — Other Ambulatory Visit (HOSPITAL_COMMUNITY): Payer: Self-pay

## 2022-10-10 ENCOUNTER — Other Ambulatory Visit: Payer: Self-pay

## 2022-10-10 ENCOUNTER — Other Ambulatory Visit (HOSPITAL_BASED_OUTPATIENT_CLINIC_OR_DEPARTMENT_OTHER): Payer: Self-pay

## 2022-10-14 ENCOUNTER — Other Ambulatory Visit (HOSPITAL_COMMUNITY): Payer: Self-pay

## 2022-10-15 ENCOUNTER — Other Ambulatory Visit: Payer: Self-pay

## 2022-10-17 ENCOUNTER — Other Ambulatory Visit: Payer: Self-pay

## 2022-10-22 ENCOUNTER — Other Ambulatory Visit: Payer: Self-pay

## 2022-10-23 ENCOUNTER — Telehealth: Payer: Self-pay

## 2022-10-23 ENCOUNTER — Ambulatory Visit: Payer: Medicare Other

## 2022-10-23 NOTE — Telephone Encounter (Signed)
This nurse called patient for scheduled AWV. She said that she has been called in to work and can not talk right now.

## 2022-10-24 ENCOUNTER — Other Ambulatory Visit: Payer: Self-pay

## 2022-12-09 ENCOUNTER — Other Ambulatory Visit: Payer: Self-pay | Admitting: Family

## 2022-12-09 DIAGNOSIS — F419 Anxiety disorder, unspecified: Secondary | ICD-10-CM

## 2022-12-10 MED ORDER — QUETIAPINE FUMARATE 400 MG PO TABS: 400.0000 mg | ORAL_TABLET | Freq: Every day | ORAL | 0 refills | Status: DC

## 2022-12-15 ENCOUNTER — Ambulatory Visit: Payer: Medicare Other | Admitting: Family Medicine

## 2023-03-12 ENCOUNTER — Ambulatory Visit (INDEPENDENT_AMBULATORY_CARE_PROVIDER_SITE_OTHER): Payer: Medicare Other

## 2023-03-12 VITALS — Ht 64.0 in | Wt 175.0 lb

## 2023-03-12 DIAGNOSIS — Z0000 Encounter for general adult medical examination without abnormal findings: Secondary | ICD-10-CM | POA: Diagnosis not present

## 2023-03-12 NOTE — Patient Instructions (Signed)
Anne Meyers , Thank you for taking time to come for your Medicare Wellness Visit. I appreciate your ongoing commitment to your health goals. Please review the following plan we discussed and let me know if I can assist you in the future.   These are the goals we discussed:  Goals      Patient Stated     03/12/2023, wants to eat healthier        This is a list of the screening recommended for you and due dates:  Health Maintenance  Topic Date Due   COVID-19 Vaccine (1) Never done   HIV Screening  Never done   Hepatitis C Screening  Never done   DTaP/Tdap/Td vaccine (1 - Tdap) Never done   Pap Smear  Never done   Medicare Annual Wellness Visit  03/11/2024   HPV Vaccine  Aged Out   Flu Shot  Discontinued    Advanced directives: Advance directive discussed with you today.   Conditions/risks identified: smoking  Next appointment: Follow up in one year for your annual wellness visit.   Preventive Care 40-64 Years, Female Preventive care refers to lifestyle choices and visits with your health care provider that can promote health and wellness. What does preventive care include? A yearly physical exam. This is also called an annual well check. Dental exams once or twice a year. Routine eye exams. Ask your health care provider how often you should have your eyes checked. Personal lifestyle choices, including: Daily care of your teeth and gums. Regular physical activity. Eating a healthy diet. Avoiding tobacco and drug use. Limiting alcohol use. Practicing safe sex. Taking low-dose aspirin daily starting at age 4. Taking vitamin and mineral supplements as recommended by your health care provider. What happens during an annual well check? The services and screenings done by your health care provider during your annual well check will depend on your age, overall health, lifestyle risk factors, and family history of disease. Counseling  Your health care provider may ask you  questions about your: Alcohol use. Tobacco use. Drug use. Emotional well-being. Home and relationship well-being. Sexual activity. Eating habits. Work and work Astronomer. Method of birth control. Menstrual cycle. Pregnancy history. Screening  You may have the following tests or measurements: Height, weight, and BMI. Blood pressure. Lipid and cholesterol levels. These may be checked every 5 years, or more frequently if you are over 9 years old. Skin check. Lung cancer screening. You may have this screening every year starting at age 72 if you have a 30-pack-year history of smoking and currently smoke or have quit within the past 15 years. Fecal occult blood test (FOBT) of the stool. You may have this test every year starting at age 44. Flexible sigmoidoscopy or colonoscopy. You may have a sigmoidoscopy every 5 years or a colonoscopy every 10 years starting at age 53. Hepatitis C blood test. Hepatitis B blood test. Sexually transmitted disease (STD) testing. Diabetes screening. This is done by checking your blood sugar (glucose) after you have not eaten for a while (fasting). You may have this done every 1-3 years. Mammogram. This may be done every 1-2 years. Talk to your health care provider about when you should start having regular mammograms. This may depend on whether you have a family history of breast cancer. BRCA-related cancer screening. This may be done if you have a family history of breast, ovarian, tubal, or peritoneal cancers. Pelvic exam and Pap test. This may be done every 3 years starting at  age 53. Starting at age 37, this may be done every 5 years if you have a Pap test in combination with an HPV test. Bone density scan. This is done to screen for osteoporosis. You may have this scan if you are at high risk for osteoporosis. Discuss your test results, treatment options, and if necessary, the need for more tests with your health care provider. Vaccines  Your health  care provider may recommend certain vaccines, such as: Influenza vaccine. This is recommended every year. Tetanus, diphtheria, and acellular pertussis (Tdap, Td) vaccine. You may need a Td booster every 10 years. Zoster vaccine. You may need this after age 59. Pneumococcal 13-valent conjugate (PCV13) vaccine. You may need this if you have certain conditions and were not previously vaccinated. Pneumococcal polysaccharide (PPSV23) vaccine. You may need one or two doses if you smoke cigarettes or if you have certain conditions. Talk to your health care provider about which screenings and vaccines you need and how often you need them. This information is not intended to replace advice given to you by your health care provider. Make sure you discuss any questions you have with your health care provider. Document Released: 11/02/2015 Document Revised: 06/25/2016 Document Reviewed: 08/07/2015 Elsevier Interactive Patient Education  2017 [employer].    Fall Prevention in the Home Falls can cause injuries. They can happen to people of all ages. There are many things you can do to make your home safe and to help prevent falls. What can I do on the outside of my home? Regularly fix the edges of walkways and driveways and fix any cracks. Remove anything that might make you trip as you walk through a door, such as a raised step or threshold. Trim any bushes or trees on the path to your home. Use bright outdoor lighting. Clear any walking paths of anything that might make someone trip, such as rocks or tools. Regularly check to see if handrails are loose or broken. Make sure that both sides of any steps have handrails. Any raised decks and porches should have guardrails on the edges. Have any leaves, snow, or ice cleared regularly. Use sand or salt on walking paths during winter. Clean up any spills in your garage right away. This includes oil or grease spills. What can I do in the bathroom? Use  night lights. Install grab bars by the toilet and in the tub and shower. Do not use towel bars as grab bars. Use non-skid mats or decals in the tub or shower. If you need to sit down in the shower, use a plastic, non-slip stool. Keep the floor dry. Clean up any water that spills on the floor as soon as it happens. Remove soap buildup in the tub or shower regularly. Attach bath mats securely with double-sided non-slip rug tape. Do not have throw rugs and other things on the floor that can make you trip. What can I do in the bedroom? Use night lights. Make sure that you have a light by your bed that is easy to reach. Do not use any sheets or blankets that are too big for your bed. They should not hang down onto the floor. Have a firm chair that has side arms. You can use this for support while you get dressed. Do not have throw rugs and other things on the floor that can make you trip. What can I do in the kitchen? Clean up any spills right away. Avoid walking on wet floors. Keep items that  you use a lot in easy-to-reach places. If you need to reach something above you, use a strong step stool that has a grab bar. Keep electrical cords out of the way. Do not use floor polish or wax that makes floors slippery. If you must use wax, use non-skid floor wax. Do not have throw rugs and other things on the floor that can make you trip. What can I do with my stairs? Do not leave any items on the stairs. Make sure that there are handrails on both sides of the stairs and use them. Fix handrails that are broken or loose. Make sure that handrails are as long as the stairways. Check any carpeting to make sure that it is firmly attached to the stairs. Fix any carpet that is loose or worn. Avoid having throw rugs at the top or bottom of the stairs. If you do have throw rugs, attach them to the floor with carpet tape. Make sure that you have a light switch at the top of the stairs and the bottom of the  stairs. If you do not have them, ask someone to add them for you. What else can I do to help prevent falls? Wear shoes that: Do not have high heels. Have rubber bottoms. Are comfortable and fit you well. Are closed at the toe. Do not wear sandals. If you use a stepladder: Make sure that it is fully opened. Do not climb a closed stepladder. Make sure that both sides of the stepladder are locked into place. Ask someone to hold it for you, if possible. Clearly mark and make sure that you can see: Any grab bars or handrails. First and last steps. Where the edge of each step is. Use tools that help you move around (mobility aids) if they are needed. These include: Canes. Walkers. Scooters. Crutches. Turn on the lights when you go into a dark area. Replace any light bulbs as soon as they burn out. Set up your furniture so you have a clear path. Avoid moving your furniture around. If any of your floors are uneven, fix them. If there are any pets around you, be aware of where they are. Review your medicines with your doctor. Some medicines can make you feel dizzy. This can increase your chance of falling. Ask your doctor what other things that you can do to help prevent falls. This information is not intended to replace advice given to you by your health care provider. Make sure you discuss any questions you have with your health care provider. Document Released: 08/02/2009 Document Revised: 03/13/2016 Document Reviewed: 11/10/2014 Elsevier Interactive Patient Education  2017 [employer].

## 2023-03-12 NOTE — Progress Notes (Signed)
I connected with  Anne Meyers on 03/12/23 by a audio enabled telemedicine application and verified that I am speaking with the correct person using two identifiers.  Patient Location: Home  Provider Location: Office/Clinic  I discussed the limitations of evaluation and management by telemedicine. The patient expressed understanding and agreed to proceed.  Subjective:   Anne Meyers is a 42 y.o. female who presents for an Initial Medicare Annual Wellness Visit.  Review of Systems     Cardiac Risk Factors include: smoking/ tobacco exposure     Objective:    Today's Vitals   03/12/23 1632  Weight: 175 lb (79.4 kg)  Height: 5\' 4"  (1.626 m)   Body mass index is 30.04 kg/m.     03/12/2023    4:36 PM 08/12/2022    6:26 AM 06/20/2022    8:45 AM 10/30/2021    9:18 PM 08/16/2021    4:08 AM 06/22/2021    8:09 PM 06/15/2021    9:59 AM  Advanced Directives  Does Patient Have a Medical Advance Directive? No No No No No No No  Would patient like information on creating a medical advance directive?  No - Patient declined No - Patient declined No - Patient declined No - Patient declined No - Patient declined No - Patient declined    Current Medications (verified) Outpatient Encounter Medications as of 03/12/2023  Medication Sig   Multiple Vitamins-Minerals (MULTIVITAMIN WITH MINERALS) tablet Take 1 tablet by mouth daily.   QUEtiapine (SEROQUEL) 400 MG tablet Take 1 tablet (400 mg total) by mouth at bedtime.   Calcium Carb-Cholecalciferol 600-10 MG-MCG TABS Take 2 tablets by mouth 2 (two) times daily for 10 days.   cetirizine (ZYRTEC) 10 MG tablet Take 1 tablet (10 mg total) by mouth daily. (Patient not taking: Reported on 08/06/2022)   diclofenac (CATAFLAM) 50 MG tablet Take 1 tablet (50 mg total) by mouth 2 (two) times daily. (Patient not taking: Reported on 08/06/2022)   diclofenac Sodium (VOLTAREN) 1 % GEL Apply 4 g topically 4 (four) times daily. (Patient not taking: Reported on  08/06/2022)   docusate sodium (COLACE) 100 MG capsule Take 1 capsule (100 mg total) by mouth 2 (two) times daily. (Patient not taking: Reported on 03/12/2023)   fluticasone (FLONASE) 50 MCG/ACT nasal spray Place 2 sprays into both nostrils daily. (Patient not taking: Reported on 08/06/2022)   gabapentin (NEURONTIN) 100 MG capsule Take 1 capsule (100 mg total) by mouth 3 (three) times daily. (Patient not taking: Reported on 04/14/2022)   hydrOXYzine (ATARAX) 25 MG tablet TAKE 1 TABLET BY MOUTH THREE TIMES A DAY AS NEEDED (Patient not taking: Reported on 03/12/2023)   levothyroxine (SYNTHROID) 125 MCG tablet Take 1 tablet (125 mcg total) by mouth daily at 6 (six) AM.   No facility-administered encounter medications on file as of 03/12/2023.    Allergies (verified) Iodinated contrast media, Wellbutrin [bupropion], Metoclopramide, and Ondansetron hcl   History: Past Medical History:  Diagnosis Date   Anemia    HX with pregnancy   Back pain    COVID-19    2022   History of kidney stones    Insomnia    Neuropathy    Palpitations    Panic disorder    Pneumonia    2013   PTSD (post-traumatic stress disorder)    Seizures (HCC)    'pseudo seizures"   Past Surgical History:  Procedure Laterality Date   lithrotripsy     lithrotripsy     THYROIDECTOMY Bilateral 08/12/2022  Procedure: TOTAL THYROIDECTOMY;  Surgeon: Laren Boom, DO;  Location: [facility] OR;  Service: ENT;  Laterality: Bilateral;   TONSILLECTOMY     TUBAL LIGATION     History reviewed. No pertinent family history. Social History   Socioeconomic History   Marital status: Single    Spouse name: Not on file   Number of children: Not on file   Years of education: Not on file   Highest education level: Not on file  Occupational History   Not on file  Tobacco Use   Smoking status: Every Day    Packs/day: .5    Types: Cigarettes    Passive exposure: Current   Smokeless tobacco: Never  Vaping Use   Vaping Use: Never  used  Substance and Sexual Activity   Alcohol use: Never   Drug use: Never   Sexual activity: Not Currently  Other Topics Concern   Not on file  Social History Narrative   Not on file   Social Determinants of Health   Financial Resource Strain: Low Risk  (03/12/2023)   Overall Financial Resource Strain (CARDIA)    Difficulty of Paying Living Expenses: Not hard at all  Food Insecurity: No Food Insecurity (03/12/2023)   Hunger Vital Sign    Worried About Running Out of Food in the Last Year: Never true    Ran Out of Food in the Last Year: Never true  Transportation Needs: No Transportation Needs (03/12/2023)   PRAPARE - Administrator, Civil Service (Medical): No    Lack of Transportation (Non-Medical): No  Physical Activity: Inactive (03/12/2023)   Exercise Vital Sign    Days of Exercise per Week: 0 days    Minutes of Exercise per Session: 0 min  Stress: No Stress Concern Present (03/12/2023)   [employer] of Occupational Health - Occupational Stress Questionnaire    Feeling of Stress : Not at all  Social Connections: Not on file    Tobacco Counseling Ready to quit: No Counseling given: Not Answered   Clinical Intake:  Pre-visit preparation completed: Yes  Pain : No/denies pain     Nutritional Status: BMI > 30  Obese Nutritional Risks: None Diabetes: No  How often do you need to have someone help you when you read instructions, pamphlets, or other written materials from your doctor or pharmacy?: 1 - Never  Diabetic? no  Interpreter Needed?: No  Information entered by :: NAllen LPN   Activities of Daily Living    03/12/2023    4:36 PM 08/12/2022    6:33 AM  In your present state of health, do you have any difficulty performing the following activities:  Hearing? 0 0  Vision? 0 0  Difficulty concentrating or making decisions? 0 0  Walking or climbing stairs? 0 0  Dressing or bathing? 0 0  Doing errands, shopping? 0   Preparing Food and  eating ? N   Using the Toilet? N   In the past six months, have you accidently leaked urine? N   Do you have problems with loss of bowel control? N   Managing your Medications? N   Managing your Finances? N   Housekeeping or managing your Housekeeping? N     Patient Care Team: Georganna Skeans, MD as PCP - General (Family Medicine)  Indicate any recent Medical Services you may have received from other than [facility] providers in the past year (date may be approximate).     Assessment:   This is a routine  wellness examination for Anne Meyers.  Hearing/Vision screen Vision Screening - Comments:: Regular eye exams, [facility]  Dietary issues and exercise activities discussed: Current Exercise Habits: The patient has a physically strenuous job, but has no regular exercise apart from work.   Goals Addressed             This Visit's Progress    Patient Stated       03/12/2023, wants to eat healthier       Depression Screen    03/12/2023    4:36 PM 04/14/2022    1:16 PM 01/22/2022    2:35 PM 07/23/2021    9:22 AM  PHQ 2/9 Scores  PHQ - 2 Score 0 0 0 0  PHQ- 9 Score  0  4    Fall Risk    03/12/2023    4:36 PM 04/14/2022    1:34 PM 01/22/2022    2:35 PM  Fall Risk   Falls in the past year? 0 0 0  Number falls in past yr: 0 0 0  Injury with Fall? 0 0 0  Risk for fall due to : Medication side effect    Follow up Falls prevention discussed;Education provided;Falls evaluation completed      FALL RISK PREVENTION PERTAINING TO THE HOME:  Any stairs in or around the home? Yes  If so, are there any without handrails? No  Home free of loose throw rugs in walkways, pet beds, electrical cords, etc? Yes  Adequate lighting in your home to reduce risk of falls? Yes   ASSISTIVE DEVICES UTILIZED TO PREVENT FALLS:  Life alert? No  Use of a cane, walker or w/c? No  Grab bars in the bathroom? No  Shower chair or bench in shower? No  Elevated toilet seat or a handicapped toilet? No   TIMED UP  AND GO:  Was the test performed? No .       Cognitive Function:        03/12/2023    4:37 PM  6CIT Screen  What Year? 0 points  What month? 0 points  What time? 0 points  Count back from 20 0 points  Months in reverse 4 points  Repeat phrase 0 points  Total Score 4 points    Immunizations  There is no immunization history on file for this patient.  TDAP status: Due, Education has been provided regarding the importance of this vaccine. Advised may receive this vaccine at local pharmacy or Health Dept. Aware to provide a copy of the vaccination record if obtained from local pharmacy or Health Dept. Verbalized acceptance and understanding.  Flu Vaccine status: Declined, Education has been provided regarding the importance of this vaccine but patient still declined. Advised may receive this vaccine at local pharmacy or Health Dept. Aware to provide a copy of the vaccination record if obtained from local pharmacy or Health Dept. Verbalized acceptance and understanding.  Pneumococcal vaccine status: Up to date  Covid-19 vaccine status: Information provided on how to obtain vaccines.   Qualifies for Shingles Vaccine? No   Zostavax completed  n/a   Shingrix Completed?: n/a  Screening Tests Health Maintenance  Topic Date Due   Medicare Annual Wellness (AWV)  Never done   COVID-19 Vaccine (1) Never done   HIV Screening  Never done   Hepatitis C Screening  Never done   DTaP/Tdap/Td (1 - Tdap) Never done   PAP SMEAR-Modifier  Never done   HPV VACCINES  Aged Out   INFLUENZA VACCINE  Discontinued    Health Maintenance  Health Maintenance Due  Topic Date Due   Medicare Annual Wellness (AWV)  Never done   COVID-19 Vaccine (1) Never done   HIV Screening  Never done   Hepatitis C Screening  Never done   DTaP/Tdap/Td (1 - Tdap) Never done   PAP SMEAR-Modifier  Never done    Colorectal cancer screening: n/a   Mammogram status: due  Bone Density status: n/a  Lung  Cancer Screening: (Low Dose CT Chest recommended if Age 76-80 years, 30 pack-year currently smoking OR have quit w/in 15years.) does not qualify.   Lung Cancer Screening Referral: no  Additional Screening:  Hepatitis C Screening: does qualify;   Vision Screening: Recommended annual ophthalmology exams for early detection of glaucoma and other disorders of the eye. Is the patient up to date with their annual eye exam?  Yes  Who is the provider or what is the name of the office in which the patient attends annual eye exams? [facility] If pt is not established with a provider, would they like to be referred to a provider to establish care? No .   Dental Screening: Recommended annual dental exams for proper oral hygiene  Community Resource Referral / Chronic Care Management: CRR required this visit?  No   CCM required this visit?  No      Plan:     I have personally reviewed and noted the following in the patient's chart:   Medical and social history Use of alcohol, tobacco or illicit drugs  Current medications and supplements including opioid prescriptions. Patient is not currently taking opioid prescriptions. Functional ability and status Nutritional status Physical activity Advanced directives List of other physicians Hospitalizations, surgeries, and ER visits in previous 12 months Vitals Screenings to include cognitive, depression, and falls Referrals and appointments  In addition, I have reviewed and discussed with patient certain preventive protocols, quality metrics, and best practice recommendations. A written personalized care plan for preventive services as well as general preventive health recommendations were provided to patient.     Barb Merino, LPN   [id number]   Nurse Notes: none  Due to this being a virtual visit, the after visit summary with patients personalized plan was offered to patient via mail or my-chart.  to pick up at office at next  visit

## 2023-04-15 ENCOUNTER — Encounter: Payer: Self-pay | Admitting: Family Medicine

## 2023-04-15 ENCOUNTER — Ambulatory Visit (INDEPENDENT_AMBULATORY_CARE_PROVIDER_SITE_OTHER): Payer: Medicare Other | Admitting: Family Medicine

## 2023-04-15 VITALS — BP 113/75 | HR 88 | Temp 98.1°F | Resp 16 | Wt 199.0 lb

## 2023-04-15 DIAGNOSIS — F419 Anxiety disorder, unspecified: Secondary | ICD-10-CM

## 2023-04-15 DIAGNOSIS — G4700 Insomnia, unspecified: Secondary | ICD-10-CM | POA: Diagnosis not present

## 2023-04-15 DIAGNOSIS — Z720 Tobacco use: Secondary | ICD-10-CM

## 2023-04-15 DIAGNOSIS — E079 Disorder of thyroid, unspecified: Secondary | ICD-10-CM

## 2023-04-15 MED ORDER — LEVOTHYROXINE SODIUM 125 MCG PO TABS: 125.0000 ug | ORAL_TABLET | Freq: Every day | ORAL | 1 refills | Status: DC

## 2023-04-15 MED ORDER — QUETIAPINE FUMARATE 400 MG PO TABS: 400.0000 mg | ORAL_TABLET | Freq: Every day | ORAL | 1 refills | Status: DC

## 2023-04-15 NOTE — Progress Notes (Signed)
Patient is here for their 3 month follow-up Patient has no concerns today Care gaps have been discussed with patient  

## 2023-04-20 ENCOUNTER — Encounter: Payer: Self-pay | Admitting: Family Medicine

## 2023-04-20 NOTE — Progress Notes (Signed)
Established Patient Office Visit  Subjective    Patient ID: Anne Meyers, female    DOB: Nov 13, 1980  Age: 42 y.o. MRN: [id number]  CC:  Chief Complaint  Patient presents with   Follow-up    HPI Anne Meyers presents for follow up of chronic med issues. Patient reports increased social stressors.    Outpatient Encounter Medications as of 04/15/2023  Medication Sig   [DISCONTINUED] levothyroxine (SYNTHROID) 125 MCG tablet Take 1 tablet (125 mcg total) by mouth daily at 6 (six) AM.   [DISCONTINUED] QUEtiapine (SEROQUEL) 400 MG tablet Take 1 tablet (400 mg total) by mouth at bedtime.   Calcium Carb-Cholecalciferol 600-10 MG-MCG TABS Take 2 tablets by mouth 2 (two) times daily for 10 days.   cetirizine (ZYRTEC) 10 MG tablet Take 1 tablet (10 mg total) by mouth daily. (Patient not taking: Reported on 08/06/2022)   diclofenac (CATAFLAM) 50 MG tablet Take 1 tablet (50 mg total) by mouth 2 (two) times daily. (Patient not taking: Reported on 08/06/2022)   diclofenac Sodium (VOLTAREN) 1 % GEL Apply 4 g topically 4 (four) times daily. (Patient not taking: Reported on 08/06/2022)   docusate sodium (COLACE) 100 MG capsule Take 1 capsule (100 mg total) by mouth 2 (two) times daily. (Patient not taking: Reported on 03/12/2023)   fluticasone (FLONASE) 50 MCG/ACT nasal spray Place 2 sprays into both nostrils daily. (Patient not taking: Reported on 08/06/2022)   gabapentin (NEURONTIN) 100 MG capsule Take 1 capsule (100 mg total) by mouth 3 (three) times daily. (Patient not taking: Reported on 04/14/2022)   hydrOXYzine (ATARAX) 25 MG tablet TAKE 1 TABLET BY MOUTH THREE TIMES A DAY AS NEEDED (Patient not taking: Reported on 03/12/2023)   levothyroxine (SYNTHROID) 125 MCG tablet Take 1 tablet (125 mcg total) by mouth daily at 6 (six) AM.   Multiple Vitamins-Minerals (MULTIVITAMIN WITH MINERALS) tablet Take 1 tablet by mouth daily.   QUEtiapine (SEROQUEL) 400 MG tablet Take 1 tablet (400 mg total) by mouth  at bedtime.   No facility-administered encounter medications on file as of 04/15/2023.    Past Medical History:  Diagnosis Date   Anemia    HX with pregnancy   Back pain    COVID-19    2022   History of kidney stones    Insomnia    Neuropathy    Palpitations    Panic disorder    Pneumonia    2013   PTSD (post-traumatic stress disorder)    Seizures (HCC)    'pseudo seizures"    Past Surgical History:  Procedure Laterality Date   lithrotripsy     lithrotripsy     THYROIDECTOMY Bilateral 08/12/2022   Procedure: TOTAL THYROIDECTOMY;  Surgeon: Laren Boom, DO;  Location: [facility] OR;  Service: ENT;  Laterality: Bilateral;   TONSILLECTOMY     TUBAL LIGATION      No family history on file.  Social History   Socioeconomic History   Marital status: Single    Spouse name: Not on file   Number of children: Not on file   Years of education: Not on file   Highest education level: GED or equivalent  Occupational History   Not on file  Tobacco Use   Smoking status: Every Day    Packs/day: .5    Types: Cigarettes    Passive exposure: Current   Smokeless tobacco: Never  Vaping Use   Vaping Use: Never used  Substance and Sexual Activity   Alcohol use: Never  Drug use: Never   Sexual activity: Not Currently  Other Topics Concern   Not on file  Social History Narrative   Not on file   Social Determinants of Health   Financial Resource Strain: Medium Risk (04/09/2023)   Overall Financial Resource Strain (CARDIA)    Difficulty of Paying Living Expenses: Somewhat hard  Food Insecurity: Food Insecurity Present (04/09/2023)   Hunger Vital Sign    Worried About Running Out of Food in the Last Year: Sometimes true    Ran Out of Food in the Last Year: Sometimes true  Transportation Needs: Unmet Transportation Needs (04/09/2023)   PRAPARE - Administrator, Civil Service (Medical): Yes    Lack of Transportation (Non-Medical): No  Physical Activity: Sufficiently  Active (04/09/2023)   Exercise Vital Sign    Days of Exercise per Week: 7 days    Minutes of Exercise per Session: 130 min  Recent Concern: Physical Activity - Inactive (03/12/2023)   Exercise Vital Sign    Days of Exercise per Week: 0 days    Minutes of Exercise per Session: 0 min  Stress: Stress Concern Present (04/09/2023)   [employer] of Occupational Health - Occupational Stress Questionnaire    Feeling of Stress : To some extent  Social Connections: Moderately Isolated (04/09/2023)   Social Connection and Isolation Panel [NHANES]    Frequency of Communication with Friends and Family: Three times a week    Frequency of Social Gatherings with Friends and Family: Never    Attends Religious Services: Never    Database administrator or Organizations: No    Attends Engineer, structural: Not on file    Marital Status: Living with partner  Intimate Partner Violence: Not on file    Review of Systems  Psychiatric/Behavioral:  Negative for suicidal ideas. The patient is nervous/anxious.   All other systems reviewed and are negative.       Objective    BP 113/75   Pulse 88   Temp 98.1 F (36.7 C) (Oral)   Resp 16   Wt 199 lb (90.3 kg)   SpO2 94%   BMI 34.16 kg/m   Physical Exam Vitals and nursing note reviewed.  Constitutional:      General: She is not in acute distress. Neck:     Thyroid: Thyroid mass present.  Cardiovascular:     Rate and Rhythm: Normal rate and regular rhythm.  Pulmonary:     Effort: Pulmonary effort is normal.     Breath sounds: Normal breath sounds.  Abdominal:     Palpations: Abdomen is soft.     Tenderness: There is no abdominal tenderness.  Musculoskeletal:     Cervical back: Normal range of motion and neck supple. No tenderness.  Neurological:     General: No focal deficit present.     Mental Status: She is alert and oriented to person, place, and time.  Psychiatric:        Mood and Affect: Affect normal. Mood is anxious.          Assessment & Plan:   1. Thyroid disorder Meds refilled  2. Tobacco abuse Discussed reduction/cessation  3. Anxiety Continue  4. Insomnia, unspecified type Seroquel refilled.   Return in about 6 months (around 10/15/2023) for follow up.   Anne Raymond, MD

## 2023-07-27 ENCOUNTER — Encounter: Payer: Self-pay | Admitting: Family Medicine

## 2023-07-28 NOTE — Telephone Encounter (Signed)
Please advise patient.  

## 2023-08-18 IMAGING — US US FNA BIOPSY THYROID 1ST LESION
1 series · 13 of 22 positions shown · non-contrast
Comparison: Thyroid ultrasound performed 07/29/2021

MEDICATIONS:
1% plain lidocaine, 1 mL

COMPLICATIONS:
None immediate.

INDICATION: Indeterminate right and left thyroid nodules

EXAM:
ULTRASOUND GUIDED FINE NEEDLE ASPIRATION OF INDETERMINATE RIGHT AND
LEFT THYROID NODULES
TECHNIQUE: Informed written consent was obtained from the patient after a
discussion of the risks, benefits and alternatives to treatment.
Questions regarding the procedure were encouraged and answered. A
timeout was performed prior to the initiation of the procedure.

[Series 1: us fna biopsy thyroid 1st lesion · 0.05mm/px · 22 acquisitions, 13 frames shown]
[im 1/22]
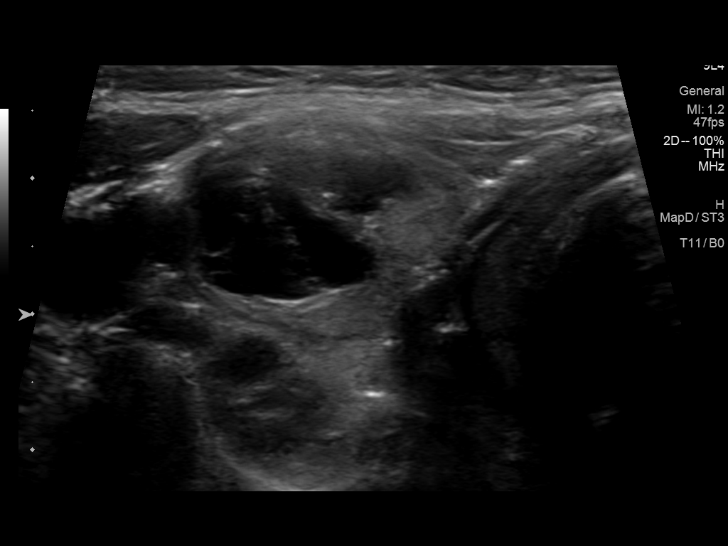
[im 3/22]
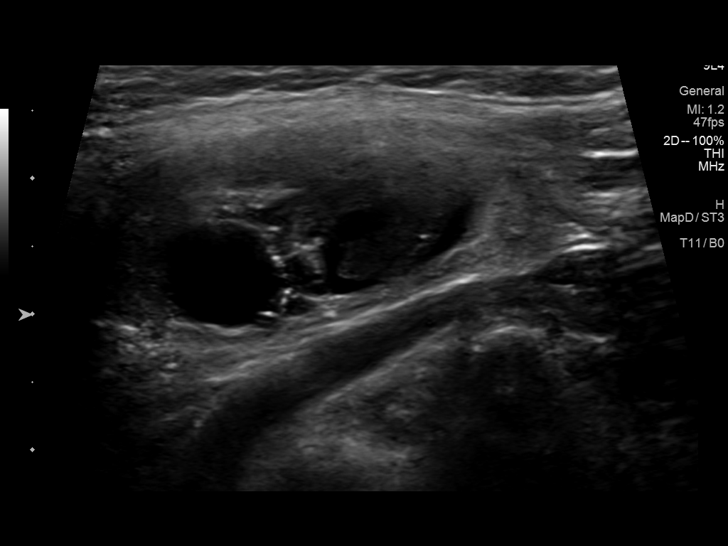
[im 5/22]
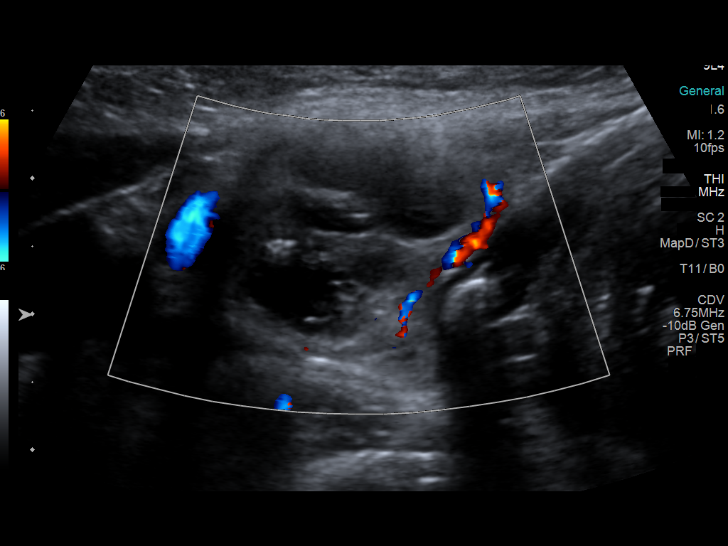
[im 6/22]
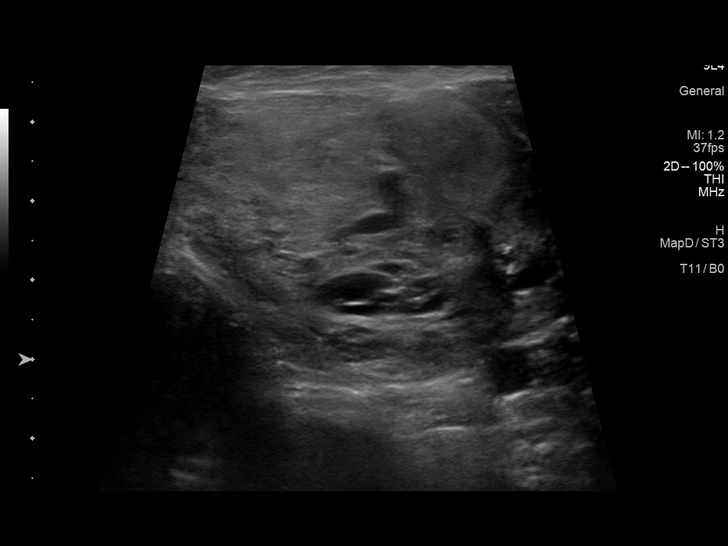
[im 8/22]
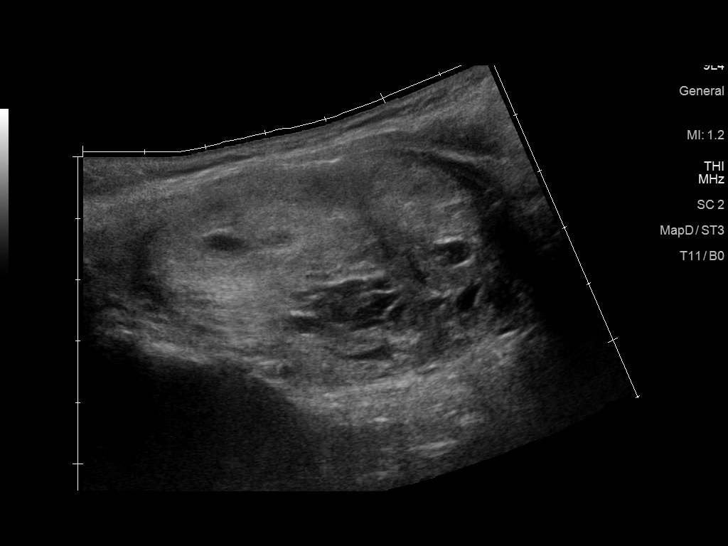
[im 10/22]
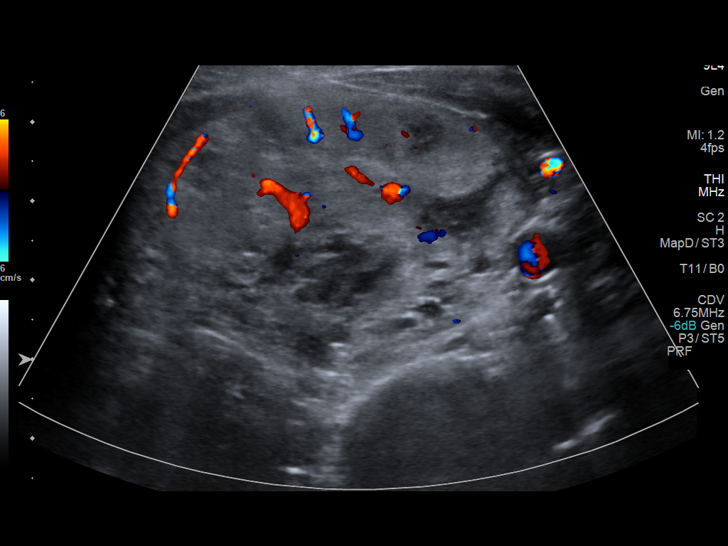
[im 12/22]
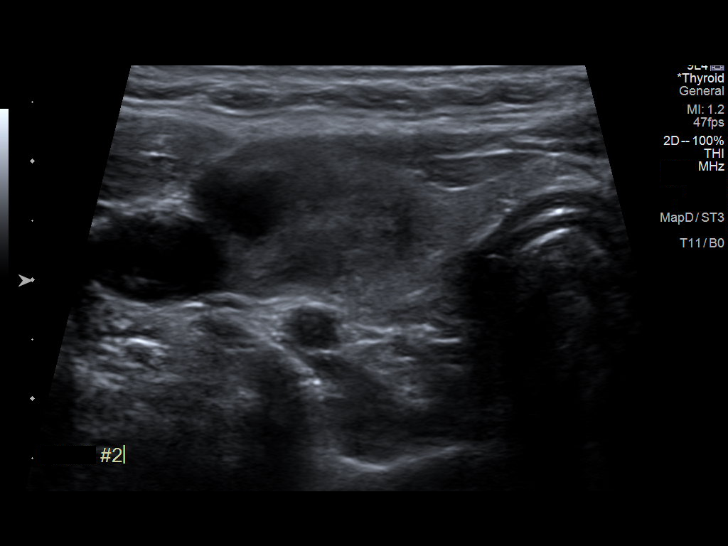
[im 13/22]
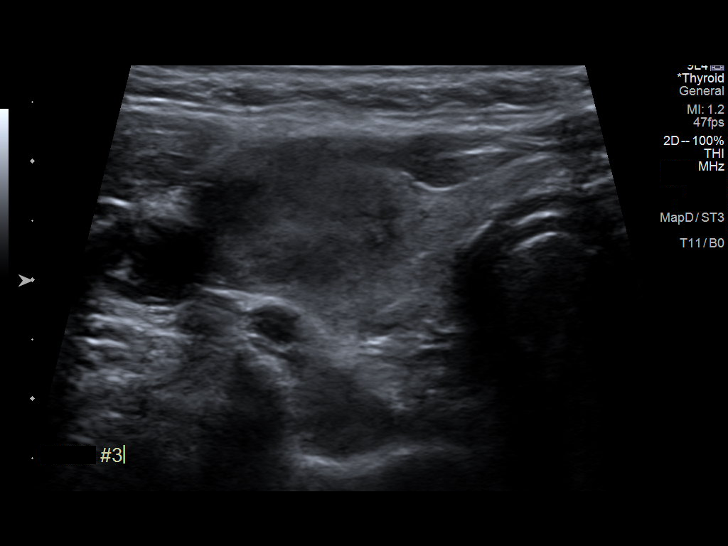
[im 15/22]
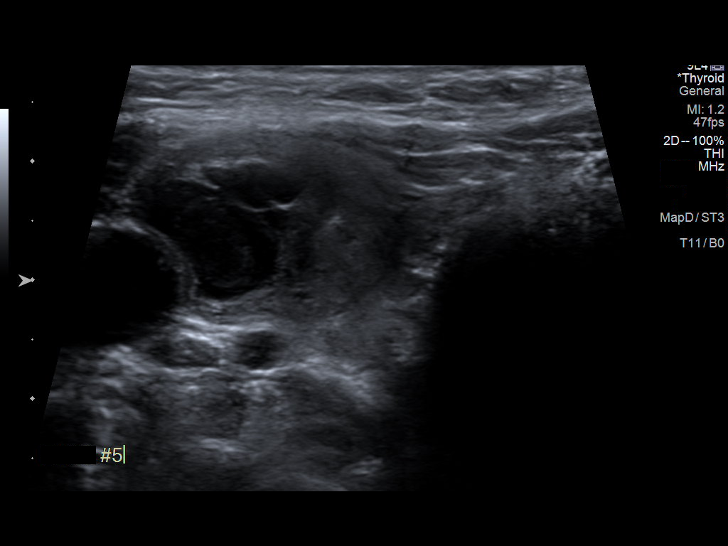
[im 17/22]
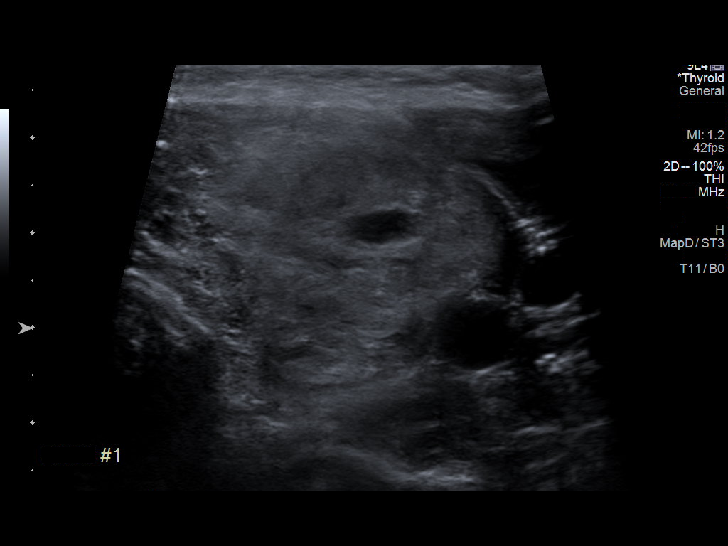
[im 18/22]
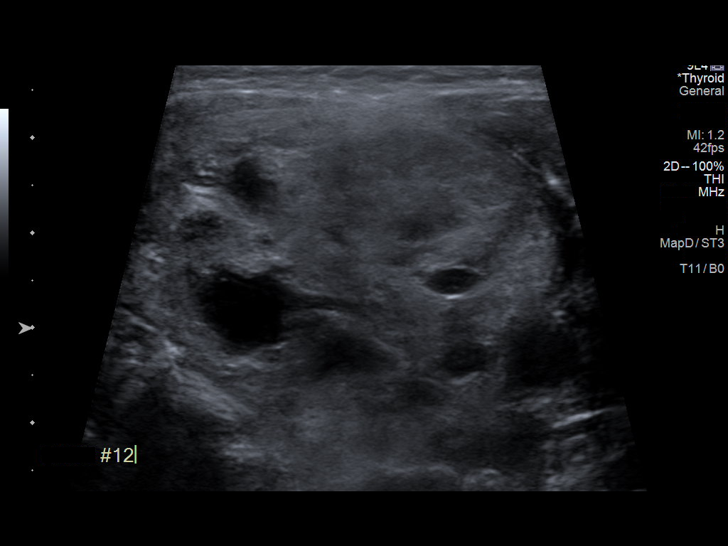
[im 20/22]
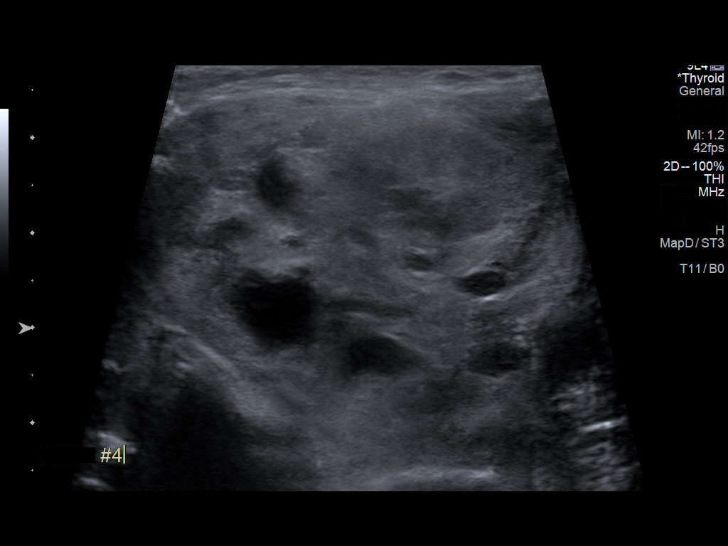
[im 22/22]
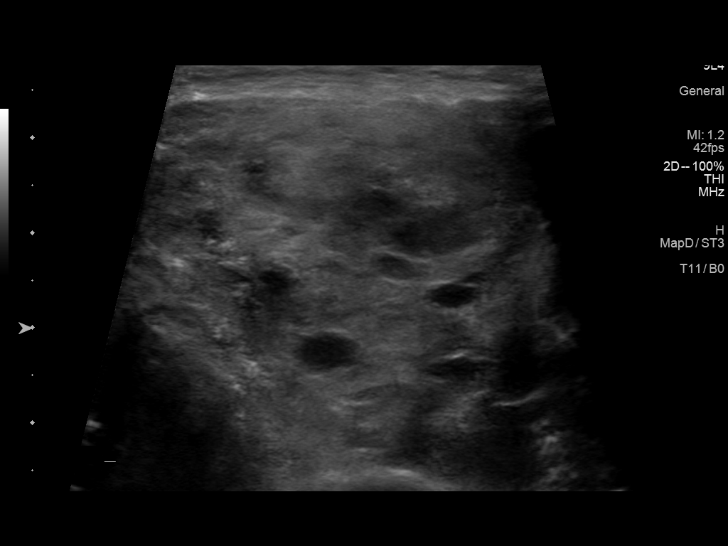

[13 of 22 positions shown; findings below may reference images not displayed]

Pre-procedural ultrasound scanning demonstrated unchanged size and
appearance of the indeterminate nodules within the right and left
thyroid lobes

The procedure was planned. The neck was prepped in the usual sterile
fashion, and a sterile drape was applied covering the operative
field. A timeout was performed prior to the initiation of the
procedure. Local anesthesia was provided with 1% lidocaine.

Under direct ultrasound guidance, 5 FNA biopsies were performed of
the right mid thyroid nodule with a 25 gauge needle. Multiple
ultrasound images were saved for procedural documentation purposes.
The samples were prepared and submitted to pathology.

Under direct ultrasound guidance, 5 FNA biopsies were performed of
the left mid thyroid nodule with a 25 gauge needle. Multiple
ultrasound images were saved for procedural documentation purposes.
The samples were prepared and submitted to pathology.

Limited post procedural scanning was negative for hematoma or
additional complication. Dressings were placed. The patient
tolerated the above procedures procedure well without immediate
postprocedural complication.
FINDINGS: FINDINGS
Nodule reference number based on prior diagnostic ultrasound: 1

Maximum size: 3.1 cm

Location: Right  ;  Mid

ACR TI-RADS total points: 3

ACR TI-RADS risk category:   TR3 (3 points)

Prior biopsy:  No

Reason for biopsy: meets ACR TI-RADS criteria

_________________________________________________________

Nodule reference number based on prior diagnostic ultrasound: 2

Maximum size: 6.4 cm

Location: Left  ;  Mid

ACR TI-RADS total points: 3

ACR TI-RADS risk category:   TR3 (3 points)

Prior biopsy:  No

Reason for biopsy: meets ACR TI-RADS criteria

Ultrasound imaging confirms appropriate placement of the needles
within the thyroid nodule.
IMPRESSION: 1. Technically successful ultrasound guided fine needle aspiration
of right mid thyroid nodule as described above.
2. Technically successful ultrasound guided fine needle aspiration
of left mid thyroid nodule as described above.

## 2023-08-18 IMAGING — US US FNA BIOPSY THYROID 1ST LESION
1 series · 13 of 22 positions shown · non-contrast
Comparison: Thyroid ultrasound performed 07/29/2021

MEDICATIONS:
1% plain lidocaine, 1 mL

COMPLICATIONS:
None immediate.

INDICATION: Indeterminate right and left thyroid nodules

EXAM:
ULTRASOUND GUIDED FINE NEEDLE ASPIRATION OF INDETERMINATE RIGHT AND
LEFT THYROID NODULES
TECHNIQUE: Informed written consent was obtained from the patient after a
discussion of the risks, benefits and alternatives to treatment.
Questions regarding the procedure were encouraged and answered. A
timeout was performed prior to the initiation of the procedure.

[Series 1: us fna biopsy thyroid 1st lesion · 0.05mm/px · 22 acquisitions, 13 frames shown]
[im 1/22]
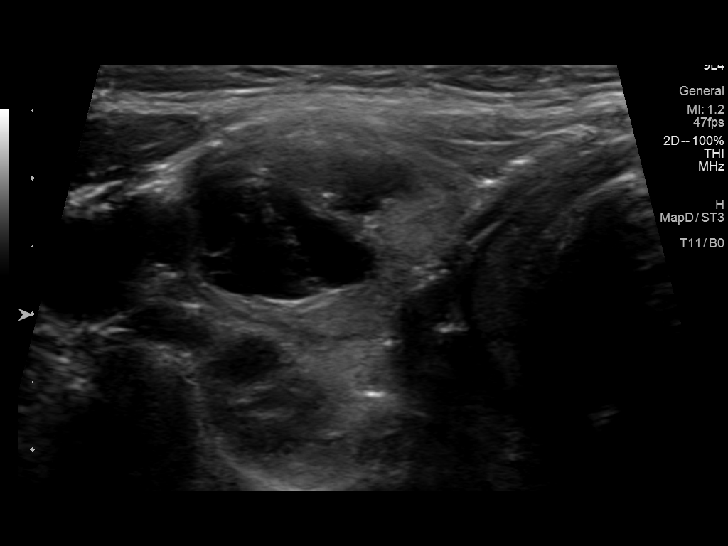
[im 3/22]
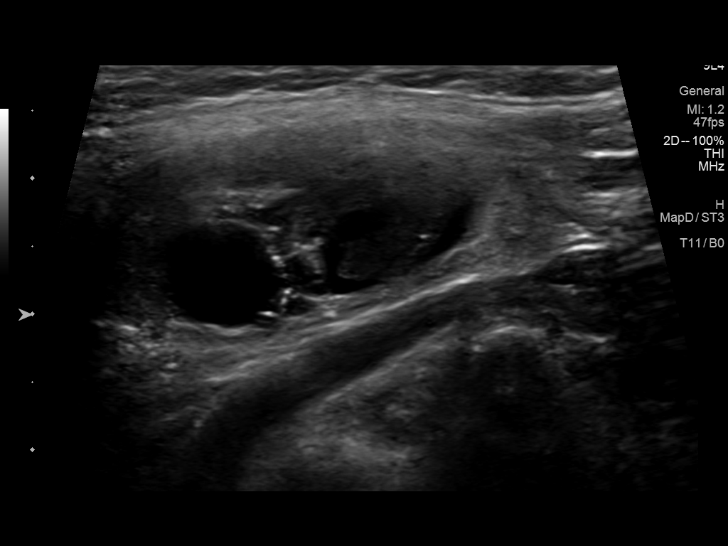
[im 5/22]
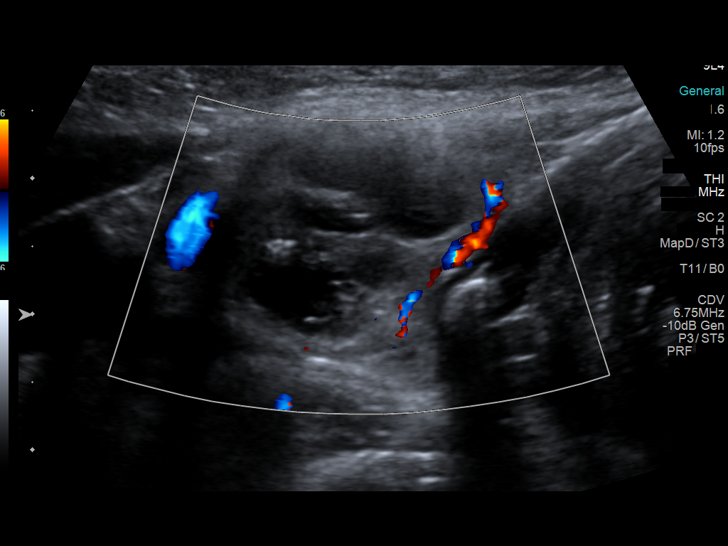
[im 6/22]
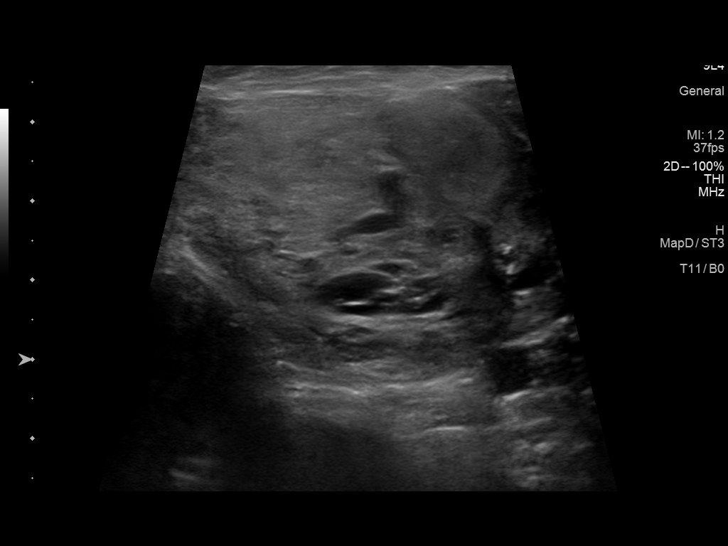
[im 8/22]
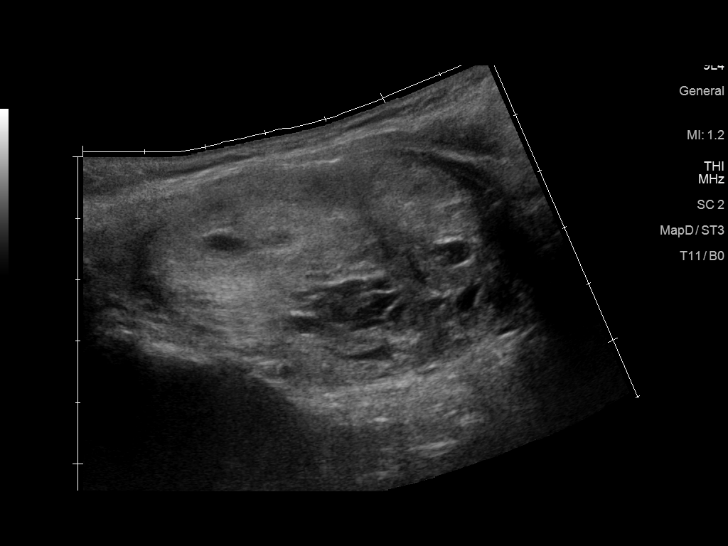
[im 10/22]
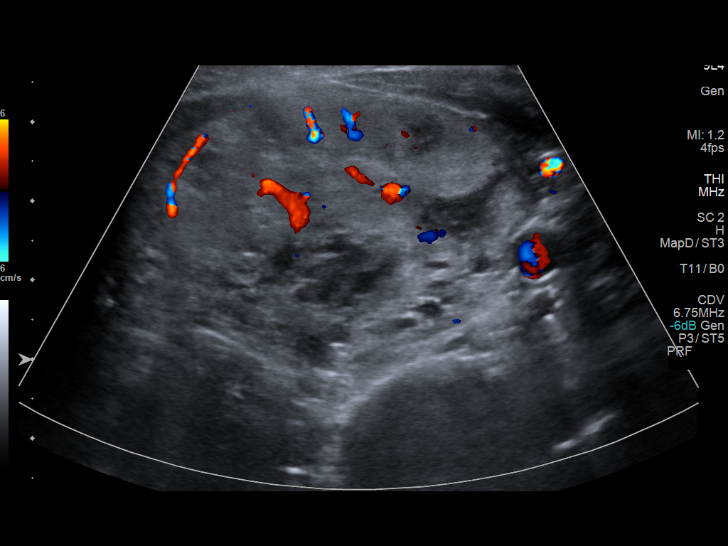
[im 12/22]
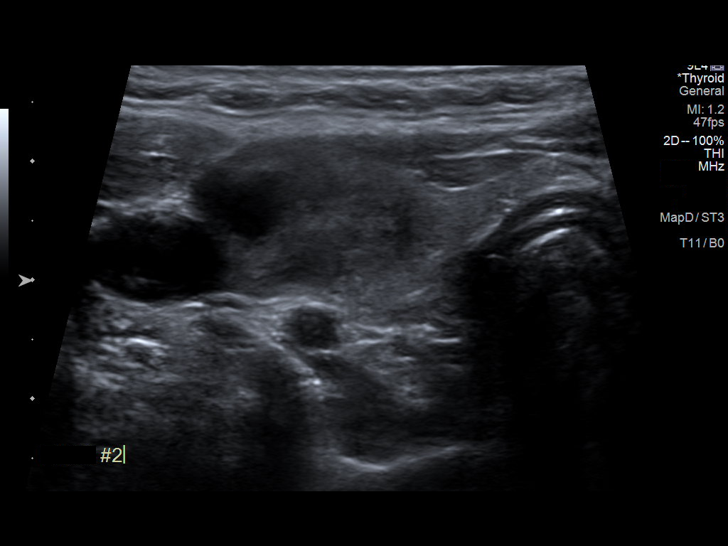
[im 13/22]
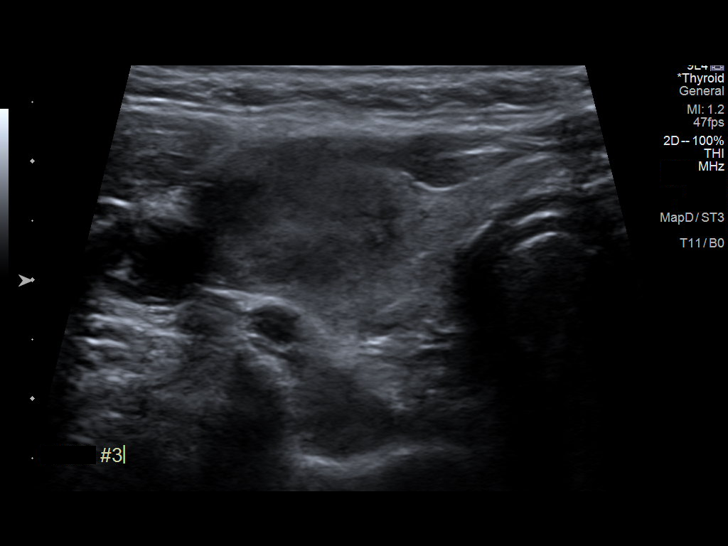
[im 15/22]
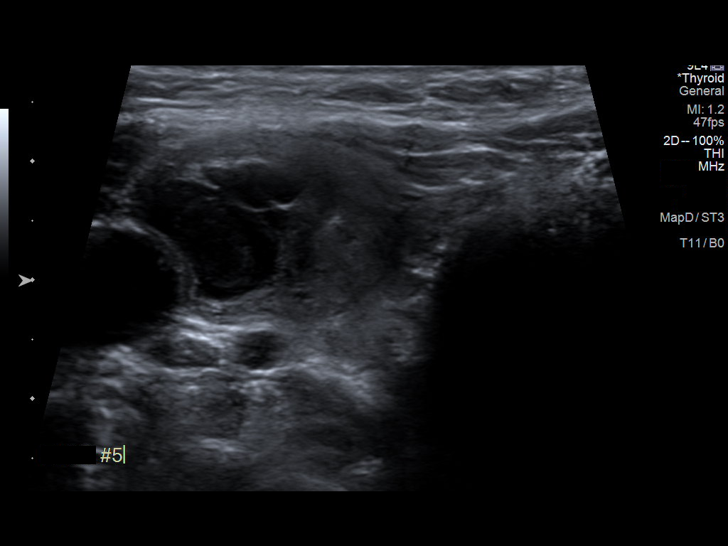
[im 17/22]
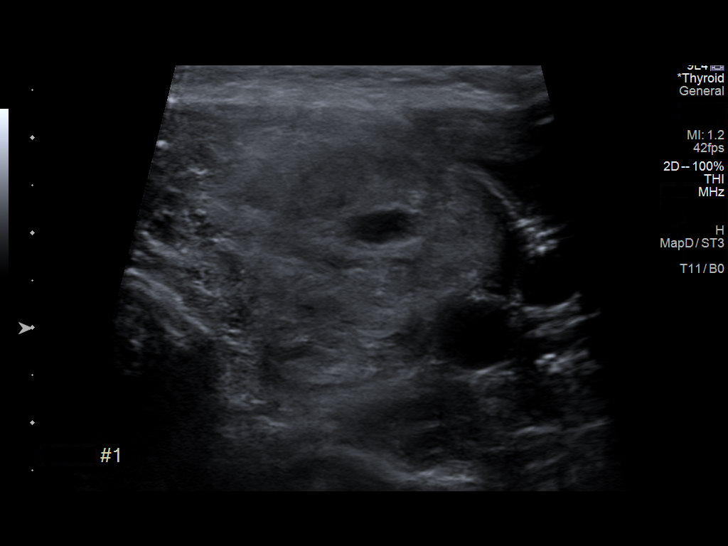
[im 18/22]
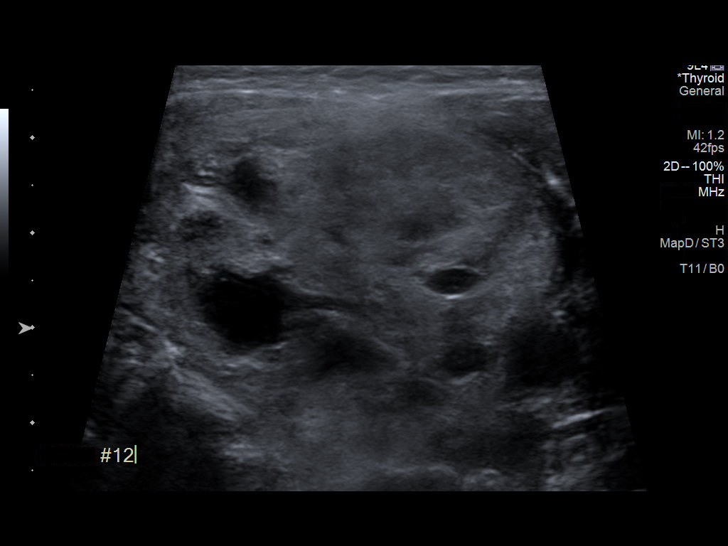
[im 20/22]
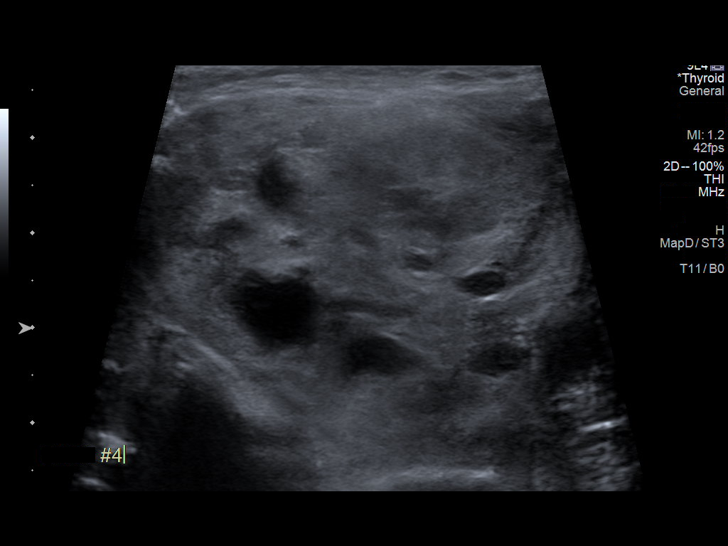
[im 22/22]
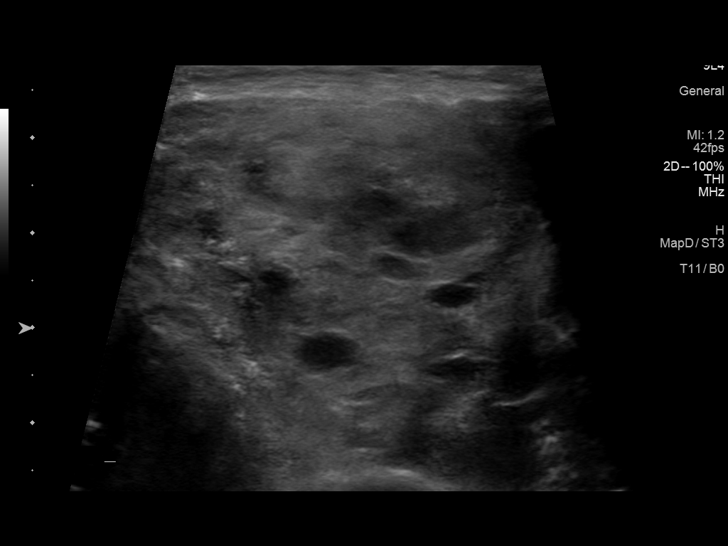

[13 of 22 positions shown; findings below may reference images not displayed]

Pre-procedural ultrasound scanning demonstrated unchanged size and
appearance of the indeterminate nodules within the right and left
thyroid lobes

The procedure was planned. The neck was prepped in the usual sterile
fashion, and a sterile drape was applied covering the operative
field. A timeout was performed prior to the initiation of the
procedure. Local anesthesia was provided with 1% lidocaine.

Under direct ultrasound guidance, 5 FNA biopsies were performed of
the right mid thyroid nodule with a 25 gauge needle. Multiple
ultrasound images were saved for procedural documentation purposes.
The samples were prepared and submitted to pathology.

Under direct ultrasound guidance, 5 FNA biopsies were performed of
the left mid thyroid nodule with a 25 gauge needle. Multiple
ultrasound images were saved for procedural documentation purposes.
The samples were prepared and submitted to pathology.

Limited post procedural scanning was negative for hematoma or
additional complication. Dressings were placed. The patient
tolerated the above procedures procedure well without immediate
postprocedural complication.
FINDINGS: FINDINGS
Nodule reference number based on prior diagnostic ultrasound: 1

Maximum size: 3.1 cm

Location: Right  ;  Mid

ACR TI-RADS total points: 3

ACR TI-RADS risk category:   TR3 (3 points)

Prior biopsy:  No

Reason for biopsy: meets ACR TI-RADS criteria

_________________________________________________________

Nodule reference number based on prior diagnostic ultrasound: 2

Maximum size: 6.4 cm

Location: Left  ;  Mid

ACR TI-RADS total points: 3

ACR TI-RADS risk category:   TR3 (3 points)

Prior biopsy:  No

Reason for biopsy: meets ACR TI-RADS criteria

Ultrasound imaging confirms appropriate placement of the needles
within the thyroid nodule.
IMPRESSION: 1. Technically successful ultrasound guided fine needle aspiration
of right mid thyroid nodule as described above.
2. Technically successful ultrasound guided fine needle aspiration
of left mid thyroid nodule as described above.

## 2023-08-29 ENCOUNTER — Other Ambulatory Visit: Payer: Self-pay | Admitting: Family Medicine

## 2023-10-06 ENCOUNTER — Ambulatory Visit: Payer: Medicare Other | Admitting: Family Medicine

## 2023-11-10 ENCOUNTER — Ambulatory Visit (INDEPENDENT_AMBULATORY_CARE_PROVIDER_SITE_OTHER): Payer: Medicare Other | Admitting: Family Medicine

## 2023-11-10 VITALS — BP 119/79 | HR 85 | Temp 98.7°F | Resp 16 | Ht 64.0 in | Wt 198.4 lb

## 2023-11-10 DIAGNOSIS — E079 Disorder of thyroid, unspecified: Secondary | ICD-10-CM

## 2023-11-10 DIAGNOSIS — M542 Cervicalgia: Secondary | ICD-10-CM | POA: Diagnosis not present

## 2023-11-10 DIAGNOSIS — Z6834 Body mass index (BMI) 34.0-34.9, adult: Secondary | ICD-10-CM

## 2023-11-10 DIAGNOSIS — R5383 Other fatigue: Secondary | ICD-10-CM | POA: Diagnosis not present

## 2023-11-10 DIAGNOSIS — R519 Headache, unspecified: Secondary | ICD-10-CM | POA: Diagnosis not present

## 2023-11-10 DIAGNOSIS — Z152 Genetic susceptibility to obesity: Secondary | ICD-10-CM

## 2023-11-10 DIAGNOSIS — E8882 Obesity due to disruption of MC4R pathway: Secondary | ICD-10-CM

## 2023-11-10 DIAGNOSIS — E66811 Obesity, class 1: Secondary | ICD-10-CM

## 2023-11-10 MED ORDER — PREDNISONE 10 MG (21) PO TBPK: ORAL_TABLET | ORAL | 0 refills | Status: AC

## 2023-11-10 NOTE — Progress Notes (Unsigned)
Established Patient Office Visit  Subjective    Patient ID: Anne Meyers, female    DOB: 04-Feb-1981  Age: 43 y.o. MRN: [id number]  CC:  Chief Complaint  Patient presents with   Neck Pain    Blood work    HPI [name] presents with complaint of fatigue. Patient also complaint os of neck pain with intermittent headaches. Patient denies known trauma or injury.   Outpatient Encounter Medications as of 11/10/2023  Medication Sig   Multiple Vitamins-Minerals (MULTIVITAMIN WITH MINERALS) tablet Take 1 tablet by mouth daily.   predniSONE (STERAPRED UNI-PAK 21 TAB) 10 MG (21) TBPK tablet Take po daily as recommended   QUEtiapine (SEROQUEL) 400 MG tablet TAKE 1 TABLET BY MOUTH AT BEDTIME.   hydrOXYzine (ATARAX) 25 MG tablet TAKE 1 TABLET BY MOUTH THREE TIMES A DAY AS NEEDED (Patient not taking: Reported on 03/12/2023)   levothyroxine (SYNTHROID) 125 MCG tablet Take 1 tablet (125 mcg total) by mouth daily at 6 (six) AM.   [DISCONTINUED] gabapentin (NEURONTIN) 100 MG capsule Take 1 capsule (100 mg total) by mouth 3 (three) times daily. (Patient not taking: Reported on 04/14/2022)   No facility-administered encounter medications on file as of 11/10/2023.    Past Medical History:  Diagnosis Date   Anemia    HX with pregnancy   Back pain    COVID-19    2022   History of kidney stones    Insomnia    Neuropathy    Palpitations    Panic disorder    Pneumonia    2013   PTSD (post-traumatic stress disorder)    Seizures (HCC)    'pseudo seizures"    Past Surgical History:  Procedure Laterality Date   lithrotripsy     lithrotripsy     THYROIDECTOMY Bilateral 08/12/2022   Procedure: TOTAL THYROIDECTOMY;  Surgeon: Marene Lenz, Meghan A, DO;  Location: [facility] OR;  Service: ENT;  Laterality: Bilateral;   TONSILLECTOMY     TUBAL LIGATION      History reviewed. No pertinent family history.  Social History   Socioeconomic History   Marital status: Single    Spouse name: Not on file    Number of children: Not on file   Years of education: Not on file   Highest education level: GED or equivalent  Occupational History   Not on file  Tobacco Use   Smoking status: Every Day    Current packs/day: 0.50    Types: Cigarettes    Passive exposure: Current   Smokeless tobacco: Never  Vaping Use   Vaping status: Never Used  Substance and Sexual Activity   Alcohol use: Never   Drug use: Never   Sexual activity: Not Currently  Other Topics Concern   Not on file  Social History Narrative   Not on file   Social Drivers of Health   Financial Resource Strain: Medium Risk (11/10/2023)   Overall Financial Resource Strain (CARDIA)    Difficulty of Paying Living Expenses: Somewhat hard  Food Insecurity: Food Insecurity Present (11/10/2023)   Hunger Vital Sign    Worried About Running Out of Food in the Last Year: Never true    Ran Out of Food in the Last Year: Sometimes true  Transportation Needs: No Transportation Needs (11/10/2023)   PRAPARE - Administrator, Civil Service (Medical): No    Lack of Transportation (Non-Medical): No  Physical Activity: Sufficiently Active (11/10/2023)   Exercise Vital Sign    Days of Exercise per  Week: 5 days    Minutes of Exercise per Session: 120 min  Stress: No Stress Concern Present (11/10/2023)   [employer] of Occupational Health - Occupational Stress Questionnaire    Feeling of Stress : Only a little  Social Connections: Unknown (11/10/2023)   Social Connection and Isolation Panel [NHANES]    Frequency of Communication with Friends and Family: Twice a week    Frequency of Social Gatherings with Friends and Family: Never    Attends Religious Services: Never    Database administrator or Organizations: No    Attends Engineer, structural: Not on file    Marital Status: Patient declined  Intimate Partner Violence: Not on file    Review of Systems  Constitutional:  Positive for malaise/fatigue.   Musculoskeletal:  Positive for neck pain.  All other systems reviewed and are negative.       Objective    BP 119/79 (BP Location: Right Arm, Patient Position: Sitting, Cuff Size: Large)   Pulse 85   Temp 98.7 F (37.1 C) (Oral)   Resp 16   Ht 5\' 4"  (1.626 m)   Wt 198 lb 6.4 oz (90 kg)   SpO2 98%   BMI 34.06 kg/m   Physical Exam Vitals and nursing note reviewed.  Constitutional:      General: She is not in acute distress. HENT:     Head: Normocephalic and atraumatic.  Cardiovascular:     Rate and Rhythm: Normal rate and regular rhythm.  Pulmonary:     Effort: Pulmonary effort is normal.     Breath sounds: Normal breath sounds.  Abdominal:     Palpations: Abdomen is soft.     Tenderness: There is no abdominal tenderness.  Musculoskeletal:     Cervical back: Normal range of motion and neck supple.  Neurological:     General: No focal deficit present.     Mental Status: She is alert and oriented to person, place, and time.         Assessment & Plan:  1. Other fatigue (Primary) Labs ordered - Basic Metabolic Panel - CBC with Differential  2. Neck pain Prednisone dosepack prescribed  3. Nonintractable headache, unspecified chronicity pattern, unspecified headache type Keep sx diary. Tylenol/nsaids prn  4. Thyroid disorder Appears stable monitoring labs ordered - TSH + free T4  5. 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   [name], MD

## 2023-11-11 LAB — CBC WITH DIFFERENTIAL/PLATELET
Basophils Absolute: 0 10*3/uL (ref 0.0–0.2)
Basos: 0 %
EOS (ABSOLUTE): 0.1 10*3/uL (ref 0.0–0.4)
Eos: 1 %
Hematocrit: 39.6 % (ref 34.0–46.6)
Hemoglobin: 13 g/dL (ref 11.1–15.9)
Immature Grans (Abs): 0 10*3/uL (ref 0.0–0.1)
Immature Granulocytes: 0 %
Lymphocytes Absolute: 2.4 10*3/uL (ref 0.7–3.1)
Lymphs: 31 %
MCH: 31.2 pg (ref 26.6–33.0)
MCHC: 32.8 g/dL (ref 31.5–35.7)
MCV: 95 fL (ref 79–97)
Monocytes Absolute: 0.5 10*3/uL (ref 0.1–0.9)
Monocytes: 7 %
Neutrophils Absolute: 4.7 10*3/uL (ref 1.4–7.0)
Neutrophils: 61 %
Platelets: 354 10*3/uL (ref 150–450)
RBC: 4.17 x10E6/uL (ref 3.77–5.28)
RDW: 11.8 % (ref 11.7–15.4)
WBC: 7.7 10*3/uL (ref 3.4–10.8)

## 2023-11-11 LAB — BASIC METABOLIC PANEL WITH GFR
BUN/Creatinine Ratio: 9 (ref 9–23)
BUN: 7 mg/dL (ref 6–24)
CO2: 22 mmol/L (ref 20–29)
Calcium: 9.6 mg/dL (ref 8.7–10.2)
Chloride: 102 mmol/L (ref 96–106)
Creatinine, Ser: 0.81 mg/dL (ref 0.57–1.00)
Glucose: 86 mg/dL (ref 70–99)
Potassium: 4.4 mmol/L (ref 3.5–5.2)
Sodium: 138 mmol/L (ref 134–144)
eGFR: 93 mL/min/{1.73_m2}

## 2023-11-11 LAB — TSH+FREE T4
Free T4: 1.34 ng/dL (ref 0.82–1.77)
TSH: 0.187 u[IU]/mL — ABNORMAL LOW (ref 0.450–4.500)

## 2023-11-12 ENCOUNTER — Encounter: Payer: Self-pay | Admitting: Family Medicine

## 2023-12-21 ENCOUNTER — Ambulatory Visit: Payer: Medicare Other | Admitting: Family Medicine

## 2023-12-30 ENCOUNTER — Ambulatory Visit: Payer: Medicare Other | Admitting: Family Medicine

## 2024-02-24 ENCOUNTER — Other Ambulatory Visit: Payer: Self-pay | Admitting: Family Medicine

## 2024-02-24 DIAGNOSIS — Z1231 Encounter for screening mammogram for malignant neoplasm of breast: Secondary | ICD-10-CM

## 2024-03-05 ENCOUNTER — Encounter (HOSPITAL_COMMUNITY): Payer: Self-pay

## 2024-03-05 ENCOUNTER — Emergency Department (HOSPITAL_COMMUNITY)
Admission: EM | Admit: 2024-03-05 | Discharge: 2024-03-05 | Disposition: A | Discharge status: Home / Self Care | Attending: Emergency Medicine | Admitting: Emergency Medicine

## 2024-03-05 ENCOUNTER — Other Ambulatory Visit: Payer: Self-pay

## 2024-03-05 DIAGNOSIS — X58XXXA Exposure to other specified factors, initial encounter: Secondary | ICD-10-CM | POA: Diagnosis not present

## 2024-03-05 DIAGNOSIS — S161XXA Strain of muscle, fascia and tendon at neck level, initial encounter: Secondary | ICD-10-CM | POA: Diagnosis not present

## 2024-03-05 DIAGNOSIS — M542 Cervicalgia: Secondary | ICD-10-CM | POA: Diagnosis present

## 2024-03-05 MED ORDER — KETOROLAC TROMETHAMINE 30 MG/ML IJ SOLN
15.0000 mg | Freq: Once | INTRAMUSCULAR | Status: DC
  Filled 2024-03-05: qty 1

## 2024-03-05 MED ORDER — TIZANIDINE HCL 4 MG PO TABS
4.0000 mg | ORAL_TABLET | Freq: Once | ORAL | Status: DC
  Filled 2024-03-05: qty 1

## 2024-03-05 MED ORDER — TIZANIDINE HCL 2 MG PO CAPS: 2.0000 mg | ORAL_CAPSULE | Freq: Three times a day (TID) | ORAL | 0 refills | Status: DC

## 2024-03-05 MED ORDER — DICLOFENAC SODIUM 1 % EX GEL
2.0000 g | Freq: Four times a day (QID) | CUTANEOUS | Status: DC
  Filled 2024-03-05: qty 100

## 2024-03-05 MED ORDER — DEXAMETHASONE SODIUM PHOSPHATE 10 MG/ML IJ SOLN
10.0000 mg | Freq: Once | INTRAMUSCULAR | Status: DC
  Filled 2024-03-05: qty 1

## 2024-03-05 MED ORDER — DICLOFENAC SODIUM 1 % EX GEL
2.0000 g | Freq: Once | CUTANEOUS | Status: DC
  Filled 2024-03-05: qty 100

## 2024-03-05 NOTE — ED Notes (Signed)
 Attempted x 2 to give meds.  Pt wants to talk to the Dr

## 2024-03-05 NOTE — Discharge Instructions (Signed)
 Neck strain tends to be an inflammatory disorder, and improved with anti-inflammatory medication, and rehab.  If you develop new, or concerning changes return here.

## 2024-03-05 NOTE — ED Provider Notes (Signed)
 [place] EMERGENCY DEPARTMENT AT [facility] Provider Note   CSN: [id number] Arrival date & time: 03/05/24  1144     History  Chief Complaint  Patient presents with   Back Pain    Anne Meyers is a 43 y.o. female.  HPI Patient presents with concern of upper back and neck pain.  Patient is generally well has no history of cervical spine surgery does have a history of cervical spine disease, she states that she has been told previously that her intervertebral spaces are narrow.  She now presents with worsening pain primarily in the upper neck, occasional ulnar neuropathy like symptoms in the left arm, no vision changes, no nausea, vomiting, no syncope.  No relief with OTC medication    Home Medications Prior to Admission medications   Medication Sig Start Date End Date Taking? Authorizing Provider  tizanidine  (ZANAFLEX ) 2 MG capsule Take 1 capsule (2 mg total) by mouth 3 (three) times daily. 03/05/24  Yes Dorenda Gandy, MD  levothyroxine  (SYNTHROID ) 125 MCG tablet Take 1 tablet (125 mcg total) by mouth daily at 6 (six) AM. 04/15/23 10/12/23  Abraham Abo, MD  Multiple Vitamins-Minerals (MULTIVITAMIN WITH MINERALS) tablet Take 1 tablet by mouth daily.    [provider]  predniSONE  (STERAPRED UNI-PAK 21 TAB) 10 MG (21) TBPK tablet Take po daily as recommended 11/10/23   Abraham Abo, MD  QUEtiapine  (SEROQUEL ) 400 MG tablet TAKE 1 TABLET BY MOUTH AT BEDTIME. 09/02/23   Abraham Abo, MD      Allergies    Iodinated contrast media, Wellbutrin [bupropion], Metoclopramide, and Ondansetron hcl    Review of Systems   Review of Systems  Physical Exam Updated Vital Signs BP 128/88 (BP Location: Left Arm)   Pulse 71   Temp 98.1 F (36.7 C) (Oral)   Resp 16   Ht 5\' 4"  (1.626 m)   Wt 93 kg   SpO2 99%   BMI 35.19 kg/m  Physical Exam Vitals and nursing note reviewed.  Constitutional:      General: She is not in acute distress.    Appearance: She is  well-developed.  HENT:     Head: Normocephalic and atraumatic.  Eyes:     Conjunctiva/sclera: Conjunctivae normal.  Neck:   Cardiovascular:     Rate and Rhythm: Normal rate and regular rhythm.     Pulses: Normal pulses.  Pulmonary:     Effort: Pulmonary effort is normal. No respiratory distress.     Breath sounds: Normal breath sounds. No stridor.  Abdominal:     General: There is no distension.  Skin:    General: Skin is warm and dry.  Neurological:     General: No focal deficit present.     Mental Status: She is alert and oriented to person, place, and time.     Cranial Nerves: No cranial nerve deficit.  Psychiatric:        Mood and Affect: Mood normal.     ED Results / Procedures / Treatments   Labs (all labs ordered are listed, but only abnormal results are displayed) Labs Reviewed - No data to display  EKG None  Radiology No results found.  Procedures Procedures    Medications Ordered in ED Medications  diclofenac  Sodium (VOLTAREN ) 1 % topical gel 2 g (has no administration in time range)    ED Course/ Medical Decision Making/ A&P  Medical Decision Making Adult female presents with neck back pain not responding to OTC medication.  Patient is awake, alert, has no red flags suggesting CNS dysfunction.  She notes she has had prior imaging without acute abnormalities, no indication for emergent imaging now.  Suspicion for inflammatory versus myelopathy versus infectious, though infection is less likely given her unremarkable vital signs.  Patient on multiple meds. Pulse ox 100% room air normal  Amount and/or Complexity of Data Reviewed External Data Reviewed: notes.  Risk Prescription drug management.   3:01 PM Patient requests discharge.  she states that she would not like to take her medication ordered here, but is amenable to taking them when she returns home and is obtained the prescription form.  She states that she is  apprehensive about taking her medication may be noted by her employer, and held against her.  No new complaints, no new weakness, given presentation concerning for acute cervical strain without neural compromise, without hemodynamic compromise, patient discharged in stable condition.        Final Clinical Impression(s) / ED Diagnoses Final diagnoses:  Strain of neck muscle, initial encounter    Rx / DC Orders ED Discharge Orders          Ordered    tizanidine  (ZANAFLEX ) 2 MG capsule  3 times daily        03/05/24 1456              Dorenda Gandy, MD 03/05/24 1502

## 2024-03-05 NOTE — ED Triage Notes (Signed)
 Pt reports with back pain that radiates into her neck and jaw x 2 days. Pt reports having a hx of back and neck problems. Pt states that her hands tingle and she has not been sleeping well due to pain.

## 2024-03-11 ENCOUNTER — Encounter

## 2024-03-11 DIAGNOSIS — Z1231 Encounter for screening mammogram for malignant neoplasm of breast: Secondary | ICD-10-CM

## 2024-05-04 ENCOUNTER — Other Ambulatory Visit: Payer: Self-pay | Admitting: Family Medicine

## 2024-05-05 ENCOUNTER — Other Ambulatory Visit: Payer: Self-pay | Admitting: *Deleted

## 2024-05-05 DIAGNOSIS — E079 Disorder of thyroid, unspecified: Secondary | ICD-10-CM

## 2024-05-09 ENCOUNTER — Other Ambulatory Visit: Payer: Self-pay | Admitting: Family Medicine

## 2024-05-09 NOTE — Telephone Encounter (Unsigned)
 Copied from CRM [id number]. Topic: Clinical - Medication Refill >> May 09, 2024  8:13 AM Emylou G wrote: Medication: levothyroxine  (SYNTHROID ) 125 MCG tablet  Has the patient contacted their pharmacy? Yes (Agent: If no, request that the patient contact the pharmacy for the refill. If patient does not wish to contact the pharmacy document the reason why and proceed with request.) (Agent: If yes, when and what did the pharmacy advise?) said to call us   This is the patient's preferred pharmacy:  [id number] - [place], [place] - [address] [place] [address] Phone: [phone] Fax: [phone]   Is this the correct pharmacy for this prescription? Yes If no, delete pharmacy and type the correct one.   Has the prescription been filled recently? No  Is the patient out of the medication? Yes  Has the patient been seen for an appointment in the last year OR does the patient have an upcoming appointment? Yes  Can we respond through MyChart? no  Agent: Please be advised that Rx refills may take up to 3 business days. We ask that you follow-up with your pharmacy.

## 2024-05-09 NOTE — Telephone Encounter (Signed)
 FYI Only or Action Required?: Action required by provider: medication refill request.  Patient was last seen in primary care on 11/10/2023 by Tanda Bleacher, MD.  Called Nurse Triage reporting No chief complaint on file..  Symptoms began today.  Interventions attempted: Nothing.  Symptoms are: stable.  Triage Disposition: No disposition on file.  Patient/caregiver understands and will follow disposition?:

## 2024-05-11 MED ORDER — LEVOTHYROXINE SODIUM 125 MCG PO TABS: 125.0000 ug | ORAL_TABLET | Freq: Every day | ORAL | 0 refills | Status: DC

## 2024-05-11 NOTE — Telephone Encounter (Signed)
 OV needed for refills.  Requested Prescriptions  Pending Prescriptions Disp Refills   levothyroxine  (SYNTHROID ) 125 MCG tablet 30 tablet 0    Sig: Take 1 tablet (125 mcg total) by mouth daily at 6 (six) AM.     Endocrinology:  Hypothyroid Agents Failed - 05/11/2024 10:45 AM      Failed - TSH in normal range and within 360 days    TSH  Date Value Ref Range Status  11/10/2023 0.187 (L) 0.450 - 4.500 uIU/mL Final         Passed - Valid encounter within last 12 months    Recent Outpatient Visits           6 months ago Other fatigue   [place] [facility], [place]   1 year ago Thyroid disorder   [place] [facility], [place]   1 year ago Anxiety   [place] [facility] [place], [place]   2 years ago Bilateral leg pain   [place] [facility], [place], [place]   2 years ago Anxiety   [place] [facility], [place]

## 2024-06-11 ENCOUNTER — Other Ambulatory Visit: Payer: Self-pay | Admitting: Family Medicine

## 2024-06-16 NOTE — Telephone Encounter (Signed)
 Appt scheduled

## 2024-07-13 ENCOUNTER — Ambulatory Visit: Admitting: Family Medicine

## 2024-07-16 ENCOUNTER — Ambulatory Visit
Admission: EM | Admit: 2024-07-16 | Discharge: 2024-07-16 | Disposition: A | Discharge status: Home / Self Care | Attending: Nurse Practitioner | Admitting: Nurse Practitioner

## 2024-07-16 ENCOUNTER — Encounter: Payer: Self-pay | Admitting: Emergency Medicine

## 2024-07-16 DIAGNOSIS — S39012A Strain of muscle, fascia and tendon of lower back, initial encounter: Secondary | ICD-10-CM

## 2024-07-16 MED ORDER — NAPROXEN 500 MG PO TABS: 500.0000 mg | ORAL_TABLET | Freq: Two times a day (BID) | ORAL | 0 refills | Status: AC

## 2024-07-16 MED ORDER — TIZANIDINE HCL 2 MG PO CAPS: 2.0000 mg | ORAL_CAPSULE | Freq: Three times a day (TID) | ORAL | 0 refills | Status: AC | PRN

## 2024-07-16 MED ORDER — KETOROLAC TROMETHAMINE 60 MG/2ML IM SOLN: 60.0000 mg | Freq: Once | INTRAMUSCULAR | Status: DC

## 2024-07-16 NOTE — Discharge Instructions (Addendum)
 You were seen today for a lumbar strain, which is an injury involving the muscles or tendons in your lower back. To help with your recovery, take all prescribed medications exactly as directed. You should also start taking the steroid dose pack that you already have as directed. While taking these medications, do not use over-the-counter anti-inflammatories such as aspirin, Motrin , ibuprofen , or Aleve , as this may increase the risk of side effects. If needed, you may take Tylenol  (acetaminophen ) 1000 mg every six hours for additional pain relief. This equals two 500 mg tablets at a time. Be careful not to take more than 4000 mg of Tylenol  in a 24-hour period. To reduce pain and inflammation, alternate between applying ice and heat to the affected area throughout the day. Always place a towel between your skin and the ice pack to avoid skin damage. Avoid lifting anything heavier than 10 pounds or carrying more than 5 pounds for the next several days, as this can worsen your symptoms. Rest as needed, and follow up with your primary care or orthopedics if symptoms do not improve or if they get worse. Go to the ED immediately if your back pain is severe, you are unable to stand or walk, you develop pain in your legs, you have weakness in your buttocks or legs., you have trouble controlling when you urinate or when you have a bowel movement, or you have frequent, painful, or bloody urination.

## 2024-07-16 NOTE — ED Provider Notes (Signed)
 [facility]    CSN: [id number] Arrival date & time: 07/16/24  0805      History   Chief Complaint Chief Complaint  Patient presents with   Back Pain    HPI Anne Meyers is a 43 y.o. female.   Discussed the use of AI scribe software for clinical note transcription with the patient, who gave verbal consent to proceed.   Patient presents with lower back pain that initially occurred 4-5 days ago when lifting a heavy box using poor body mechanics. The patient has a history of back problems and had a slipped disc 10 years ago, though reports this current episode feels different. The initial injury occurred when the patient bent over to pick up a heavy box, realized it was heavy halfway through the motion, and put it down. A couple of hours later, back pain developed. The pain initially started to improve with Motrin , heating pad, and prednisone , but yesterday the patient re-injured the back while putting bleach bottles up at work, causing the pain to worsen significantly. The pain is located across the right lower back and radiates down both legs into the calves, with the right side being more affected. The patient describes being unable to bend and having to go down to the floor with knees to pick things up. The pain sometimes extends into the hips and buttocks. Lifting the leg straight up, particularly on the right side, exacerbates the pain. The patient favors the right side when walking. The pain has significantly impacted sleep, with the patient reporting not sleeping all night. When lying on the side with knees up, the patient experiences temporary relief but becomes stiff and has difficulty rolling over. The patient has been taking large amounts of Motrin  over the past 2 days and tried lidocaine  gel without relief. This morning, the patient took Zanaflex  and felt looser for about half an hour before the pain began radiating down the legs again. The patient denies any bowel or  bladder incontinence.  The following sections of the patient's history were reviewed and updated as appropriate: allergies, current medications, past family history, past medical history, past social history, past surgical history, and problem list.     Past Medical History:  Diagnosis Date   Anemia    HX with pregnancy   Back pain    COVID-19    2022   History of kidney stones    Insomnia    Neuropathy    Palpitations    Panic disorder    Pneumonia    2013   PTSD (post-traumatic stress disorder)    Seizures (HCC)    'pseudo seizures    Patient Active Problem List   Diagnosis Date Noted   Thyroid mass 08/12/2022   Multinodular goiter 08/12/2022   Bilateral leg pain 01/24/2022   Cervical radiculitis 01/24/2022   Anxiety 01/24/2022   Thyroid nodule 01/24/2022   Thyroid disorder 01/24/2022   Tobacco abuse 01/24/2022   Hypokalemia 01/24/2022   Class 1 obesity due to excess calories with body mass index (BMI) of 31.0 to 31.9 in adult 01/24/2022    Past Surgical History:  Procedure Laterality Date   lithrotripsy     lithrotripsy     THYROIDECTOMY Bilateral 08/12/2022   Procedure: TOTAL THYROIDECTOMY;  Surgeon: Llewellyn Gerard LABOR, DO;  Location: [facility] OR;  Service: ENT;  Laterality: Bilateral;   TONSILLECTOMY     TUBAL LIGATION      OB History   No obstetric history on file.  Home Medications    Prior to Admission medications   Medication Sig Start Date End Date Taking? Authorizing Provider  naproxen  (NAPROSYN ) 500 MG tablet Take 1 tablet (500 mg total) by mouth 2 (two) times daily with a meal. Take with food to avoid stomach upset. Do not take any additional NSAIDs while on this. You may take tylenol  in addition to this if needed for extra pain relief. 07/16/24  Yes Jersi Mcmaster, FNP  levothyroxine  (SYNTHROID ) 125 MCG tablet Take 1 tablet (125 mcg total) by mouth daily at 6 (six) AM. 05/11/24 11/07/24  Tanda Bleacher, MD  Multiple Vitamins-Minerals  (MULTIVITAMIN WITH MINERALS) tablet Take 1 tablet by mouth daily.    [provider]  predniSONE  (STERAPRED UNI-PAK 21 TAB) 10 MG (21) TBPK tablet Take po daily as recommended Patient not taking: Reported on 07/16/2024 11/10/23   Tanda Bleacher, MD  QUEtiapine  (SEROQUEL ) 400 MG tablet TAKE 1 TABLET BY MOUTH AT BEDTIME. 06/16/24   Tanda Bleacher, MD  tizanidine  (ZANAFLEX ) 2 MG capsule Take 1 capsule (2 mg total) by mouth 3 (three) times daily as needed for muscle spasms. 07/16/24   Iola Lukes, FNP    Family History No family history on file.  Social History Social History   Tobacco Use   Smoking status: Every Day    Current packs/day: 0.50    Types: Cigarettes    Passive exposure: Current   Smokeless tobacco: Never  Vaping Use   Vaping status: Never Used  Substance Use Topics   Alcohol use: Never   Drug use: Never     Allergies   Iodinated contrast media, Wellbutrin [bupropion], Metoclopramide, and Ondansetron hcl   Review of Systems Review of Systems  Gastrointestinal:  Negative for blood in stool.       No bowel incontinence   Genitourinary:  Negative for hematuria.       No bladder incontinence   Musculoskeletal:  Positive for back pain (lower).  Neurological:  Negative for numbness.  Psychiatric/Behavioral:  Positive for sleep disturbance (due to back pain).   All other systems reviewed and are negative.    Physical Exam Triage Vital Signs ED Triage Vitals  Encounter Vitals Group     BP 07/16/24 0853 122/84     Girls Systolic BP Percentile --      Girls Diastolic BP Percentile --      Boys Systolic BP Percentile --      Boys Diastolic BP Percentile --      Pulse Rate 07/16/24 0853 88     Resp 07/16/24 0853 18     Temp 07/16/24 0853 98.3 F (36.8 C)     Temp Source 07/16/24 0853 Oral     SpO2 07/16/24 0853 98 %     Weight --      Height --      Head Circumference --      Peak Flow --      Pain Score 07/16/24 0854 7     Pain Loc --       Pain Education --      Exclude from Growth Chart --    No data found.  Updated Vital Signs BP 122/84 (BP Location: Left Arm)   Pulse 88   Temp 98.3 F (36.8 C) (Oral)   Resp 18   LMP 06/27/2024 (Exact Date)   SpO2 98%   Visual Acuity Right Eye Distance:   Left Eye Distance:   Bilateral Distance:    Right Eye Near:  Left Eye Near:    Bilateral Near:     Physical Exam Vitals reviewed.  Constitutional:      General: She is not in acute distress.    Appearance: Normal appearance. She is not ill-appearing, toxic-appearing or diaphoretic.  HENT:     Head: Normocephalic.     Mouth/Throat:     Mouth: Mucous membranes are moist.  Cardiovascular:     Rate and Rhythm: Normal rate and regular rhythm.  Pulmonary:     Effort: Pulmonary effort is normal.     Breath sounds: Normal breath sounds.  Abdominal:     Palpations: Abdomen is soft.     Tenderness: There is no right CVA tenderness or left CVA tenderness.  Musculoskeletal:     Cervical back: Normal, normal range of motion and neck supple.     Thoracic back: Normal.     Lumbar back: Tenderness present. No swelling, deformity, lacerations or spasms. Normal range of motion. Positive right straight leg raise test. Negative left straight leg raise test.       Back:  Skin:    General: Skin is warm and dry.  Neurological:     General: No focal deficit present.     Mental Status: She is alert and oriented to person, place, and time.     Cranial Nerves: Cranial nerves 2-12 are intact.     Sensory: Sensation is intact.     Motor: Motor function is intact. No weakness.     Coordination: Coordination is intact.     Gait: Gait is intact.  Psychiatric:        Mood and Affect: Mood normal.        Speech: Speech normal.        Behavior: Behavior normal. Behavior is cooperative.      UC Treatments / Results  Labs (all labs ordered are listed, but only abnormal results are displayed) Labs Reviewed - No data to  display  EKG   Radiology No results found.  Procedures Procedures (including critical care time)  Medications Ordered in UC Medications - No data to display  Initial Impression / Assessment and Plan / UC Course  I have reviewed the triage vital signs and the nursing notes.  Pertinent labs & imaging results that were available during my care of the patient were reviewed by me and considered in my medical decision making (see chart for details).    Patient presents with low back pain. No numbness, tingling, or weakness in the buttocks or legs. No urinary symptoms or blood in stool. Examination and history are consistent with a low back muscle strain. Toradol  and dexamethasone  injections offered but patient declined. Naproxen  prescribed twice daily. Patient has prednisone  dose pack at home and was advised to start this as directed. She was provided with additional Zanaflex  to be taken three times a day as needed. Extra-strength acetaminophen  may be taken for additional pain relief, but no other NSAIDs should be used while on naproxen . Advised application of moist heat, avoidance of heavy lifting or strenuous activity, and follow-up with orthopedics in 10 days if symptoms persist. Patient instructed to seek immediate care for new numbness, weakness, bowel or bladder incontinence, severe worsening pain, or fever.  Today's evaluation has revealed no signs of a dangerous process. Discussed diagnosis with patient and/or guardian. Patient and/or guardian aware of their diagnosis, possible red flag symptoms to watch out for and need for close follow up. Patient and/or guardian understands verbal and written discharge instructions. Patient  and/or guardian comfortable with plan and disposition.  Patient and/or guardian has a clear mental status at this time, good insight into illness (after discussion and teaching) and has clear judgment to make decisions regarding their care  Documentation was completed  with the aid of voice recognition software. Transcription may contain typographical errors.   Final Clinical Impressions(s) / UC Diagnoses   Final diagnoses:  Strain of lumbar paraspinous muscle, initial encounter     Discharge Instructions      You were seen today for a lumbar strain, which is an injury involving the muscles or tendons in your lower back. To help with your recovery, take all prescribed medications exactly as directed. You should also start taking the steroid dose pack that you already have as directed. While taking these medications, do not use over-the-counter anti-inflammatories such as aspirin, Motrin , ibuprofen , or Aleve , as this may increase the risk of side effects. If needed, you may take Tylenol  (acetaminophen ) 1000 mg every six hours for additional pain relief. This equals two 500 mg tablets at a time. Be careful not to take more than 4000 mg of Tylenol  in a 24-hour period. To reduce pain and inflammation, alternate between applying ice and heat to the affected area throughout the day. Always place a towel between your skin and the ice pack to avoid skin damage. Avoid lifting anything heavier than 10 pounds or carrying more than 5 pounds for the next several days, as this can worsen your symptoms. Rest as needed, and follow up with your primary care or orthopedics if symptoms do not improve or if they get worse. Go to the ED immediately if your back pain is severe, you are unable to stand or walk, you develop pain in your legs, you have weakness in your buttocks or legs., you have trouble controlling when you urinate or when you have a bowel movement, or you have frequent, painful, or bloody urination.     ED Prescriptions     Medication Sig Dispense Auth. Provider   tizanidine  (ZANAFLEX ) 2 MG capsule Take 1 capsule (2 mg total) by mouth 3 (three) times daily as needed for muscle spasms. 21 capsule Iola Lukes, FNP   naproxen  (NAPROSYN ) 500 MG tablet Take 1  tablet (500 mg total) by mouth 2 (two) times daily with a meal. Take with food to avoid stomach upset. Do not take any additional NSAIDs while on this. You may take tylenol  in addition to this if needed for extra pain relief. 20 tablet Iola Lukes, FNP      I have reviewed the PDMP during this encounter.   Iola Lukes, [place] 07/16/24 [phone]

## 2024-07-16 NOTE — ED Triage Notes (Signed)
 Pt st's she hurt her back 3-4 days ago picking up a box.  St's it was starting to feel better but she was putting up supplies at work and injured her back again   Pt c/o mid lower back pain radiating into both legs

## 2024-07-25 ENCOUNTER — Ambulatory Visit

## 2024-08-01 ENCOUNTER — Ambulatory Visit: Admitting: Family Medicine

## 2024-08-08 ENCOUNTER — Other Ambulatory Visit: Payer: Self-pay | Admitting: Family Medicine

## 2024-08-12 ENCOUNTER — Telehealth: Payer: Self-pay

## 2024-08-12 NOTE — Telephone Encounter (Signed)
 Copied from CRM [id number]. Topic: Clinical - Medication Question >> Aug 12, 2024 10:35 AM Shanda MATSU wrote: Reason for CRM: Patient is wanting to know if she really needs to see provider to get med refill for med, QUEtiapine  (SEROQUEL ) 400 MG tablet, patient stated provider adv her that she would not but pharmacy is telling her something different, is req a call back in regards to this.

## 2024-09-13 ENCOUNTER — Other Ambulatory Visit: Payer: Self-pay | Admitting: Family Medicine

## 2024-09-13 MED ORDER — QUETIAPINE FUMARATE 400 MG PO TABS: 400.0000 mg | ORAL_TABLET | Freq: Every day | ORAL | 0 refills | Status: DC

## 2024-09-28 ENCOUNTER — Ambulatory Visit: Admitting: Family Medicine

## 2024-09-28 VITALS — BP 120/77 | HR 91 | Ht 64.0 in | Wt 181.2 lb

## 2024-09-28 DIAGNOSIS — Z720 Tobacco use: Secondary | ICD-10-CM

## 2024-09-28 DIAGNOSIS — G4700 Insomnia, unspecified: Secondary | ICD-10-CM

## 2024-09-28 DIAGNOSIS — F419 Anxiety disorder, unspecified: Secondary | ICD-10-CM

## 2024-09-28 DIAGNOSIS — E079 Disorder of thyroid, unspecified: Secondary | ICD-10-CM

## 2024-09-28 MED ORDER — QUETIAPINE FUMARATE 400 MG PO TABS: 400.0000 mg | ORAL_TABLET | Freq: Every day | ORAL | 1 refills | Status: AC

## 2024-09-28 MED ORDER — LEVOTHYROXINE SODIUM 125 MCG PO TABS: 125.0000 ug | ORAL_TABLET | Freq: Every day | ORAL | 1 refills | Status: AC

## 2024-10-04 NOTE — Progress Notes (Signed)
 Established Patient Office Visit  Subjective    Patient ID: Anne Meyers, female    DOB: [birth year]  Age: 43 y.o. MRN: [id number]  CC:  Chief Complaint  Patient presents with   Medical Management of Chronic Issues    HPI Anne Meyers presents for routine follow up of hypothyroidism and insomnia. Patient reports med compliance and denies acute complaints.   Outpatient Encounter Medications as of 09/28/2024  Medication Sig   Multiple Vitamins-Minerals (MULTIVITAMIN WITH MINERALS) tablet Take 1 tablet by mouth daily.   naproxen  (NAPROSYN ) 500 MG tablet Take 1 tablet (500 mg total) by mouth 2 (two) times daily with a meal. Take with food to avoid stomach upset. Do not take any additional NSAIDs while on this. You may take tylenol  in addition to this if needed for extra pain relief.   tizanidine  (ZANAFLEX ) 2 MG capsule Take 1 capsule (2 mg total) by mouth 3 (three) times daily as needed for muscle spasms.   [DISCONTINUED] QUEtiapine  (SEROQUEL ) 400 MG tablet Take 1 tablet (400 mg total) by mouth at bedtime.   levothyroxine  (SYNTHROID ) 125 MCG tablet Take 1 tablet (125 mcg total) by mouth daily at 6 (six) AM.   predniSONE  (STERAPRED UNI-PAK 21 TAB) 10 MG (21) TBPK tablet Take po daily as recommended (Patient not taking: Reported on 09/28/2024)   QUEtiapine  (SEROQUEL ) 400 MG tablet Take 1 tablet (400 mg total) by mouth at bedtime.   [DISCONTINUED] levothyroxine  (SYNTHROID ) 125 MCG tablet Take 1 tablet (125 mcg total) by mouth daily at 6 (six) AM.   No facility-administered encounter medications on file as of 09/28/2024.    Past Medical History:  Diagnosis Date   Anemia    HX with pregnancy   Back pain    COVID-19    2022   History of kidney stones    Insomnia    Neuropathy    Palpitations    Panic disorder    Pneumonia    2013   PTSD (post-traumatic stress disorder)    Seizures (HCC)    'pseudo seizures    Past Surgical History:  Procedure Laterality Date    lithrotripsy     lithrotripsy     THYROIDECTOMY Bilateral 08/12/2022   Procedure: TOTAL THYROIDECTOMY;  Surgeon: Llewellyn, Meghan A, DO;  Location: [facility] OR;  Service: ENT;  Laterality: Bilateral;   TONSILLECTOMY     TUBAL LIGATION      History reviewed. No pertinent family history.  Social History   Socioeconomic History   Marital status: Single    Spouse name: Not on file   Number of children: Not on file   Years of education: Not on file   Highest education level: GED or equivalent  Occupational History   Not on file  Tobacco Use   Smoking status: Every Day    Current packs/day: 0.50    Types: Cigarettes    Passive exposure: Current   Smokeless tobacco: Never  Vaping Use   Vaping status: Never Used  Substance and Sexual Activity   Alcohol use: Never   Drug use: Never   Sexual activity: Not Currently  Other Topics Concern   Not on file  Social History Narrative   Not on file   Social Drivers of Health   Tobacco Use: High Risk (09/28/2024)   Patient History    Smoking Tobacco Use: Every Day    Smokeless Tobacco Use: Never    Passive Exposure: Current  Financial Resource Strain: Low Risk (09/28/2024)   Overall Financial  Resource Strain (CARDIA)    Difficulty of Paying Living Expenses: Not very hard  Food Insecurity: No Food Insecurity (09/28/2024)   Epic    Worried About Programme Researcher, Broadcasting/film/video in the Last Year: Never true    Ran Out of Food in the Last Year: Never true  Transportation Needs: Unmet Transportation Needs (09/28/2024)   Epic    Lack of Transportation (Medical): Yes    Lack of Transportation (Non-Medical): No  Physical Activity: Sufficiently Active (09/28/2024)   Exercise Vital Sign    Days of Exercise per Week: 5 days    Minutes of Exercise per Session: 50 min  Stress: Stress Concern Present (09/28/2024)   [employer] of Occupational Health - Occupational Stress Questionnaire    Feeling of Stress: To some extent  Social Connections:  Moderately Isolated (09/28/2024)   Social Connection and Isolation Panel    Frequency of Communication with Friends and Family: More than three times a week    Frequency of Social Gatherings with Friends and Family: Never    Attends Religious Services: Never    Database Administrator or Organizations: No    Attends Engineer, Structural: Not on file    Marital Status: Living with partner  Intimate Partner Violence: Not on file  Depression (PHQ2-9): Low Risk (11/10/2023)   Depression (PHQ2-9)    PHQ-2 Score: 0  Alcohol Screen: Low Risk (11/10/2023)   Alcohol Screen    Last Alcohol Screening Score (AUDIT): 0  Housing: Low Risk (09/28/2024)   Epic    Unable to Pay for Housing in the Last Year: No    Number of Times Moved in the Last Year: 0    Homeless in the Last Year: No  Utilities: Not At Risk (11/10/2023)   [employer] Utilities    Threatened with loss of utilities: No  Health Literacy: Adequate Health Literacy (11/10/2023)   B1300 Health Literacy    Frequency of need for help with medical instructions: Never    Review of Systems  All other systems reviewed and are negative.       Objective    BP 120/77   Pulse 91   Ht 5' 4 (1.626 m)   Wt 181 lb 3.2 oz (82.2 kg)   LMP 08/31/2024 (Approximate)   SpO2 100%   BMI 31.10 kg/m   Physical Exam Vitals and nursing note reviewed.  Constitutional:      General: She is not in acute distress. HENT:     Head: Normocephalic and atraumatic.  Cardiovascular:     Rate and Rhythm: Normal rate and regular rhythm.  Pulmonary:     Effort: Pulmonary effort is normal.     Breath sounds: Normal breath sounds.  Abdominal:     Palpations: Abdomen is soft.     Tenderness: There is no abdominal tenderness.  Musculoskeletal:     Cervical back: Normal range of motion and neck supple.  Neurological:     General: No focal deficit present.     Mental Status: She is alert and oriented to person, place, and time.         Assessment &  Plan:   1. Thyroid disorder (Primary) Appears stable. continue  2. Insomnia, unspecified type Seroquel  provided  3. Anxiety As above  4. Tobacco abuse Discussed reduction/cessation     No follow-ups on file.   Tanda Raguel SQUIBB, MD

## 2024-11-17 ENCOUNTER — Encounter: Payer: Self-pay | Admitting: Family Medicine

## 2025-02-09 ENCOUNTER — Ambulatory Visit: Payer: Self-pay

## 2025-03-29 ENCOUNTER — Ambulatory Visit: Admitting: Family Medicine
